# Patient Record
Sex: Male | Born: 1935 | Race: White | Hispanic: No | Marital: Married | Smoking: Former smoker
Health system: Southern US, Community
[De-identification: ages and names within clinical notes are randomized; demographics above are authoritative.]

## PROBLEM LIST (undated history)

## (undated) DIAGNOSIS — E039 Hypothyroidism, unspecified: Secondary | ICD-10-CM

## (undated) DIAGNOSIS — G20 Parkinson's disease: Secondary | ICD-10-CM

## (undated) DIAGNOSIS — H409 Unspecified glaucoma: Secondary | ICD-10-CM

## (undated) DIAGNOSIS — S0990XA Unspecified injury of head, initial encounter: Secondary | ICD-10-CM

## (undated) HISTORY — DX: Unspecified glaucoma: H40.9

## (undated) HISTORY — DX: Parkinson's disease: G20

## (undated) HISTORY — DX: Unspecified injury of head, initial encounter: S09.90XA

## (undated) HISTORY — DX: Hypothyroidism, unspecified: E03.9

## (undated) HISTORY — PX: OTHER SURGICAL HISTORY: SHX169

---

## 1989-05-18 DIAGNOSIS — S0990XA Unspecified injury of head, initial encounter: Secondary | ICD-10-CM

## 1989-05-18 HISTORY — DX: Unspecified injury of head, initial encounter: S09.90XA

## 1999-04-01 ENCOUNTER — Encounter: Payer: Self-pay | Admitting: Emergency Medicine

## 1999-04-01 ENCOUNTER — Ambulatory Visit (HOSPITAL_COMMUNITY): Admission: EM | Admit: 1999-04-01 | Discharge: 1999-04-01 | Payer: Self-pay | Admitting: Emergency Medicine

## 2013-01-10 ENCOUNTER — Ambulatory Visit (INDEPENDENT_AMBULATORY_CARE_PROVIDER_SITE_OTHER): Payer: Medicare Other | Admitting: Neurology

## 2013-01-10 ENCOUNTER — Encounter: Payer: Self-pay | Admitting: Neurology

## 2013-01-10 VITALS — BP 140/76 | HR 80 | Temp 98.4°F | Resp 16 | Ht 69.0 in | Wt 155.0 lb

## 2013-01-10 DIAGNOSIS — G20A1 Parkinson's disease without dyskinesia, without mention of fluctuations: Secondary | ICD-10-CM | POA: Insufficient documentation

## 2013-01-10 DIAGNOSIS — R269 Unspecified abnormalities of gait and mobility: Secondary | ICD-10-CM

## 2013-01-10 DIAGNOSIS — F0280 Dementia in other diseases classified elsewhere without behavioral disturbance: Secondary | ICD-10-CM | POA: Insufficient documentation

## 2013-01-10 DIAGNOSIS — G20 Parkinson's disease: Secondary | ICD-10-CM

## 2013-01-10 MED ORDER — CARBIDOPA-LEVODOPA 25-100 MG PO TABS: 1.0000 | ORAL_TABLET | Freq: Three times a day (TID) | ORAL | Status: DC

## 2013-01-10 NOTE — Progress Notes (Signed)
Stephen Cole was seen today in the movement disorders clinic for neurologic consultation at the request of Kari Baars, MD.  The consultation is for the evaluation of PD and associated memory changes.  This patient is accompanied in the office by his spouse who supplements the history.  The pt reports that sx's began about 7-8 years ago and started with R hand tremor.  He was dx with PD by Dr. Sandria Manly and no medication was recommended.  The patient has not seen Dr. Sandria Manly in many years, and I have none of those records.  Pt has hx of closed head injury in 1991.  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  There was no bleeding of the brain.  He went through 8-9 months of cognitive neurorehab.  There was no weakness associated with the injury.     Specific Symptoms:  Tremor: yes Voice: less strength than used to Sleep:   Vivid Dreams:  no  Acting out dreams:  no Wet Pillows: no Postural symptoms:  yes  Falls?  no Bradykinesia symptoms: slow movements, slowed thought processes and difficulty getting out of a chair Loss of smell:  yes Loss of taste:  yes Urinary Incontinence:  yes (rare) Difficulty Swallowing:  yes (rarely) Handwriting, micrographia: yes Trouble with ADL's:  yes  Trouble buttoning clothing: yes Depression:  no per pt Memory changes:  yes Hallucinations:  yes (rare with bugs)  visual distortions: no N/V:  no Lightheaded:  no  Syncope: no Diplopia:  no Dyskinesia:  no  Neuroimaging has not previously been performed.    PREVIOUS MEDICATIONS: none to date  ALLERGIES:  No Known Allergies  CURRENT MEDICATIONS:  No current outpatient prescriptions on file prior to visit.   No current facility-administered medications on file prior to visit.    PAST MEDICAL HISTORY:   Past Medical History  Diagnosis Date  . Closed head injury 1991  . Parkinson disease   . Hypothyroid   . Glaucoma      PAST SURGICAL HISTORY:   Past Surgical History  Procedure Laterality Date  . Partial thumb amputation      SOCIAL HISTORY:   History   Social History  . Marital Status: Married    Spouse Name: N/A    Number of Children: N/A  . Years of Education: N/A   Occupational History  . Not on file.   Social History Main Topics  . Smoking status: Former Games developer  . Smokeless tobacco: Former Neurosurgeon  . Alcohol Use: No  . Drug Use: No  . Sexual Activity: Not on file   Other Topics Concern  . Not on file   Social History Narrative  . No narrative on file    FAMILY HISTORY:   Family Status  Relation Status Death Age  . Mother Deceased 4    CHF  . Father Deceased   . Sister Deceased     cancer    ROS:  R knee pain.  A complete 10 system review of systems was obtained and was unremarkable apart from what is mentioned above.  PHYSICAL EXAMINATION:    VITALS:   Filed Vitals:   01/10/13 0832  BP: 140/76  Pulse: 80  Temp: 98.4 F (36.9 C)  TempSrc: Oral  Resp: 16  Height: 5\' 9"  (1.753 m)  Weight: 155 lb (70.308 kg)    GEN:  The patient appears stated age and  is in NAD. HEENT:  Normocephalic, atraumatic.  The mucous membranes are moist. The superficial temporal arteries are without ropiness or tenderness. CV:  RRR Lungs:  CTAB Neck/HEME:  There are no carotid bruits bilaterally.  Neurological examination:  Orientation: The patient scored a 10/30 on his MoCA.   Cranial nerves: There is good facial symmetry. There is significant facial hypomimia.  Pupils are equal round and reactive to light bilaterally. Fundoscopic exam reveals clear margins bilaterally. Extraocular muscles are intact. The visual fields are full to confrontational testing. The speech is fluent and clear. Soft palate rises symmetrically and there is no tongue deviation. Hearing is intact to conversational tone. Sensation: Sensation is intact to light and pinprick throughout (facial, trunk, extremities).  Vibration is intact at the bilateral big toe. There is no extinction with double simultaneous stimulation. There is no sensory dermatomal level identified. Motor: Strength is 5/5 in the bilateral upper and lower extremities.   Shoulder shrug is equal and symmetric.  There is no pronator drift. Deep tendon reflexes: Deep tendon reflexes are 2+/4 at the bilateral biceps, triceps, brachioradialis, 2+-3 at the bilateral patella and trace at the bilateral achilles. There is a striatal toe on the R.  Plantar response is down on the L.  Movement examination: Tone: There is increased tone in the bilateral upper extremities. The R is mod to severe and the L is mild to mod. The tone in the lower extremities is normal.  Abnormal movements: There is a near constant resting tremor in the RUE and an intermittent resting tremor in the LUE Coordination:  There is decremation with RAM's with all forms, including alternating supination and pronation of the forearm, hand opening and closing, finger taps, heel taps and toe taps. Gait and Station: The patient has  difficulty arising out of a deep-seated chair without the use of the hands. The patient's stride length is markedly decreased with shuffling and turning en bloc.  His L leg sticks to the ground when he walks.  He is bowlegged severely on the R.  Labs:  I did review labs from his primary care physician.  Labs received were last done one year ago on 11/23/2011.  Sodium was 141, potassium 4.0, chloride 107, CO2 25, BUN 24 and creatinine 1.1.  White blood cells were 4.9, hemoglobin 15.6, hematocrit 44.8 and platelets 181.  Hemoglobin A1c was 5.4.  In 2012, a B12 was completed and was 389.  ASSESSMENT/PLAN:  1.  Parkinsonism.  I suspect that this does represent idiopathic Parkinson's disease.  The patient has tremor, bradykinesia, rigidity and postural instability.  -We discussed the diagnosis as well as pathophysiology of the disease.  We discussed treatment  options as well as prognostic indicators.  Patient education was provided.  -Greater than 50% of the 80 minute visit was spent in counseling answering questions and talking about what to expect now as well as in the future.  We talked about medication options as well as potential future surgical options.  We talked about safety in the home.  -We decided to add carbidopa/levodopa 25/100.  1/2 tab tid x 1 wk, then 1/2 in am & noon & 1 at night for a week, then 1/2 in am &1 at noon &night for a week, then 1 po tid.  Risks, benefits, side effects and alternative therapies were discussed.  The opportunity to ask questions was given and they were answered to the best of my ability.  The patient expressed understanding and willingness to follow the  outlined treatment protocols.  -I wanted to refer the patient [facility] for [facility]/OT and ST.  They wanted to hold on that.  His wife suggested home therapy, which I have no objection to, but the pt wanted to hold on that for now.  We talked about the importance of cardiovascular exercise in Parkinson's disease. 2.  Memory changes, likely due to PD related dementia  -He is on aricept and he is not driving.  We discussed safety in the home. 3.  We may need to do neuroimaging in the future but decided to hold on that today.  I will see him back in the next 8 weeks, sooner should new neurologic issues arise.

## 2013-01-10 NOTE — Patient Instructions (Signed)
1.  I want you to safely exercise 2.  Start carbidopa/levodopa 25/100 as follows:  1/2 tab three times a day before meals x 1 wk, then 1/2 in am & noon & 1 in evening for a week, then 1/2 in am &1 at noon &one in evening for a week, then 1 tablet three times a day before meals 3.  Follow up on 10/31

## 2013-03-17 ENCOUNTER — Ambulatory Visit (INDEPENDENT_AMBULATORY_CARE_PROVIDER_SITE_OTHER): Payer: Medicare Other | Admitting: Neurology

## 2013-03-17 ENCOUNTER — Ambulatory Visit: Payer: Medicare Other | Admitting: Neurology

## 2013-03-17 ENCOUNTER — Encounter: Payer: Self-pay | Admitting: Neurology

## 2013-03-17 VITALS — BP 138/74 | HR 58 | Resp 14 | Wt 158.0 lb

## 2013-03-17 DIAGNOSIS — F0280 Dementia in other diseases classified elsewhere without behavioral disturbance: Secondary | ICD-10-CM

## 2013-03-17 DIAGNOSIS — G20 Parkinson's disease: Secondary | ICD-10-CM

## 2013-03-17 MED ORDER — CARBIDOPA-LEVODOPA 25-100 MG PO TABS: 2.0000 | ORAL_TABLET | Freq: Three times a day (TID) | ORAL | Status: DC

## 2013-03-17 NOTE — Patient Instructions (Signed)
1.  Increase your carbidopa/levodopa 25/100 as follows:  Week 1:  1 before breakfast and lunch and 2 before dinner  Week 2:  1 before breakfast and 2 before lunch and 2 before dinner  Week 3 and after:  2 tablets before each meal

## 2013-03-17 NOTE — Progress Notes (Signed)
Stephen Cole was seen today in the movement disorders clinic for neurologic consultation at the request of Kari Baars, MD.  The consultation is for the evaluation of PD and associated memory changes.  This patient is accompanied in the office by his spouse who supplements the history.  The pt reports that sx's began about 7-8 years ago and started with R hand tremor.  He was dx with PD by Dr. Sandria Manly and no medication was recommended.  The patient has not seen Dr. Sandria Manly in many years, and I have none of those records.  Pt has hx of closed head injury in 1991.  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  There was no bleeding of the brain.  He went through 8-9 months of cognitive neurorehab.  There was no weakness associated with the injury.    03/17/13 Update:  The patient is following up today regarding his newly diagnosed Parkinson's disease.  His wife was present in the lobby, but does not accompany him to the room.  We started levodopa in August, 2014.  The patient refused therapies.  The patient does not think that the levodopa has been beneficial.  He has not had any falls.  He denies hallucinations.  He denies nausea or vomiting.  He denies lightheadedness or near syncope.   Neuroimaging has not previously been performed.    PREVIOUS MEDICATIONS: none to date  ALLERGIES:  No Known Allergies  CURRENT MEDICATIONS:  Current Outpatient Prescriptions on File Prior to Visit  Medication Sig Dispense Refill  . carbidopa-levodopa (SINEMET IR) 25-100 MG per tablet Take 1 tablet by mouth 3 (three) times daily.  90 tablet  3  . donepezil (ARICEPT) 10 MG tablet       . mirtazapine (REMERON) 15 MG tablet Take 15 mg by mouth every other day.      . simvastatin (ZOCOR) 40 MG tablet       . SYNTHROID 100 MCG tablet        No current facility-administered medications on file prior to visit.    PAST MEDICAL HISTORY:   Past  Medical History  Diagnosis Date  . Closed head injury 1991  . Parkinson disease   . Hypothyroid   . Glaucoma     PAST SURGICAL HISTORY:   Past Surgical History  Procedure Laterality Date  . Partial thumb amputation      SOCIAL HISTORY:   History   Social History  . Marital Status: Married    Spouse Name: N/A    Number of Children: N/A  . Years of Education: N/A   Occupational History  . Not on file.   Social History Main Topics  . Smoking status: Former Games developer  . Smokeless tobacco: Former Neurosurgeon  . Alcohol Use: No  . Drug Use: No  . Sexual Activity: Not on file   Other Topics Concern  . Not on file   Social History Narrative  . No narrative on file    FAMILY HISTORY:   Family Status  Relation Status Death Age  . Mother Deceased 97    CHF  . Father Deceased   . Sister Deceased     cancer    ROS:  R knee pain.  A complete 10 system review of systems was obtained and was unremarkable apart from what is mentioned above.  PHYSICAL EXAMINATION:    VITALS:   There were no  vitals filed for this visit.  GEN:  The patient appears stated age and is in NAD.  He is agitated today. HEENT:  Normocephalic, atraumatic.  The mucous membranes are moist. The superficial temporal arteries are without ropiness or tenderness. CV:  RRR Lungs:  CTAB Neck/HEME:  There are no carotid bruits bilaterally.  Neurological examination:  Orientation: The patient scored a 10/30 on his MoCA last visit.   Cranial nerves: There is good facial symmetry. There is significant facial hypomimia.  Pupils are equal round and reactive to light bilaterally. Fundoscopic exam reveals clear margins bilaterally. Extraocular muscles are intact. The visual fields are full to confrontational testing. The speech is fluent and clear. Soft palate rises symmetrically and there is no tongue deviation. Hearing is intact to conversational tone. Sensation: Sensation is intact to light touch throughout. Motor:  Strength is 5/5 in the bilateral upper and lower extremities.   Shoulder shrug is equal and symmetric.  There is no pronator drift. Deep tendon reflexes: Not tested today.  Movement examination: Tone: There is increased tone in the right upper extremity.  Tone in the left is normal today. Abnormal movements: There is a near constant resting tremor in the RUE.  No tremor on the left was noted today. Coordination:  There is decremation with RAM's with all forms, including alternating supination and pronation of the forearm, hand opening and closing, finger taps, heel taps and toe taps.  This was primarily seen on the right today. Gait and Station: The patient has  difficulty arising out of a deep-seated chair without the use of the hands. The patient's stride length is markedly decreased with shuffling and turning en bloc.    Labs:  I did review labs from his primary care physician.  Labs received were last done one year ago on 11/23/2011.  Sodium was 141, potassium 4.0, chloride 107, CO2 25, BUN 24 and creatinine 1.1.  White blood cells were 4.9, hemoglobin 15.6, hematocrit 44.8 and platelets 181.  Hemoglobin A1c was 5.4.  In 2012, a B12 was completed and was 389.  ASSESSMENT/PLAN:  1.  Parkinsonism.  I suspect that this does represent idiopathic Parkinson's disease.  The patient has tremor, bradykinesia, rigidity and postural instability.  -We discussed the diagnosis as well as pathophysiology of the disease.  We discussed treatment options as well as prognostic indicators.  Patient education was provided.  -Although the patient does not think that the medication is helping, I do think it is helping based on a comparison of my examination last visit.  However, I do think that he needs an increase the dose of the carbidopa/levodopa.  He was very resistant, but ultimately was agreeable to increase the medication slowly to a total of 2 tablets 3 times per day.  Risks, benefits, side effects and  alternative therapies were discussed.  The opportunity to ask questions was given and they were answered to the best of my ability.  The patient expressed understanding and willingness to follow the outlined treatment protocols.  -I again wanted to refer the patient to the Parkinson's program at the [facility], for PT/OT and ST.  He refused. 2.  Memory changes, likely due to PD related dementia  -He is on aricept and he is not driving.  We discussed safety in the home. 3.  We may need to do neuroimaging in the future given his hx of TBI but pt refused today.

## 2013-05-01 ENCOUNTER — Other Ambulatory Visit: Payer: Self-pay

## 2013-05-01 MED ORDER — CARBIDOPA-LEVODOPA 25-100 MG PO TABS: 2.0000 | ORAL_TABLET | Freq: Three times a day (TID) | ORAL | Status: DC

## 2013-06-16 ENCOUNTER — Ambulatory Visit: Payer: Medicare Other | Admitting: Neurology

## 2013-06-19 ENCOUNTER — Ambulatory Visit: Payer: Medicare Other | Admitting: Neurology

## 2013-06-22 ENCOUNTER — Telehealth: Payer: Self-pay | Admitting: Neurology

## 2013-06-22 NOTE — Telephone Encounter (Signed)
Pt no showed 3 month follow up 06/19/13. No show letter mailed to pt  Roanna Raider/Sherri

## 2013-07-04 ENCOUNTER — Ambulatory Visit: Payer: Medicare Other | Admitting: Neurology

## 2013-07-05 ENCOUNTER — Telehealth: Payer: Self-pay | Admitting: Neurology

## 2013-07-05 NOTE — Telephone Encounter (Signed)
LM for pt to return my call to r/s appt. Office was closed d/t inclement weather, Currently awaiting a return call / Sherri S.

## 2013-08-15 ENCOUNTER — Ambulatory Visit (INDEPENDENT_AMBULATORY_CARE_PROVIDER_SITE_OTHER): Payer: Medicare Other | Admitting: Neurology

## 2013-08-15 ENCOUNTER — Encounter: Payer: Self-pay | Admitting: Neurology

## 2013-08-15 VITALS — BP 126/76 | HR 60 | Resp 14 | Ht 69.0 in | Wt 161.0 lb

## 2013-08-15 DIAGNOSIS — Z87820 Personal history of traumatic brain injury: Secondary | ICD-10-CM

## 2013-08-15 DIAGNOSIS — F0280 Dementia in other diseases classified elsewhere without behavioral disturbance: Secondary | ICD-10-CM

## 2013-08-15 DIAGNOSIS — G20 Parkinson's disease: Secondary | ICD-10-CM

## 2013-08-15 MED ORDER — CARBIDOPA-LEVODOPA 25-100 MG PO TABS
2.0000 | ORAL_TABLET | Freq: Three times a day (TID) | ORAL | Status: DC
Start: 2013-08-15 — End: 2014-03-07

## 2013-08-15 NOTE — Patient Instructions (Signed)
Let us know if you are interested in the [facility] physical therapy program. Follow up in 6 months.

## 2013-08-15 NOTE — Progress Notes (Signed)
Stephen Cole was seen today in the movement disorders clinic for neurologic consultation at the request of Martha Clan, MD.  The consultation is for the evaluation of PD and associated memory changes.  This patient is accompanied in the office by his spouse who supplements the history.  The pt reports that sx's began about 7-8 years ago and started with R hand tremor.  He was dx with PD by Dr. Sandria Manly and no medication was recommended.  The patient has not seen Dr. Sandria Manly in many years, and I have none of those records.  Pt has hx of closed head injury in 1991.  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  There was no bleeding of the brain.  He went through 8-9 months of cognitive neurorehab.  There was no weakness associated with the injury.    03/17/13 Update:  The patient is following up today regarding his newly diagnosed Parkinson's disease.  His wife was present in the lobby, but does not accompany him to the room.  We started levodopa in August, 2014.  The patient refused therapies.  The patient does not think that the levodopa has been beneficial.  He has not had any falls.  He denies hallucinations.  He denies nausea or vomiting.  He denies lightheadedness or near syncope.  08/15/13 update:  Pt is f/u regarding PD.  He n/s his 3 month f/u.  When I last saw him in October, I increased his levodopa to 2 po tid.  Pt states at the beginning of the visit that he no longer wishes to take the medication.  "I don't think that it is worth it."  It has not helped the tremor but he agrees it may have helped stiffness.  His wife states that over the years he has become very angry.  He is tired of feeling that he has to "go along with the game."  His wife states that he will have periods of time when he doesn't shake at all (when reading) but when anxious, he shakes a lot.  He did not take levodopa this morning, and last took it about 10pm  last evening.  Unfortunately, he has continued to refuse PD related therapies.  He is on aricept for PDD.  He denies falls, hallucinations, lightheadedness, near syncope, dyskinesia.   Neuroimaging has not previously been performed.    PREVIOUS MEDICATIONS: none to date  ALLERGIES:  No Known Allergies  CURRENT MEDICATIONS:  Current Outpatient Prescriptions on File Prior to Visit  Medication Sig Dispense Refill  . carbidopa-levodopa (SINEMET IR) 25-100 MG per tablet Take 2 tablets by mouth 3 (three) times daily.  180 tablet  3  . donepezil (ARICEPT) 10 MG tablet       . mirtazapine (REMERON) 15 MG tablet Take 15 mg by mouth every other day.      . simvastatin (ZOCOR) 40 MG tablet       . SYNTHROID 100 MCG tablet        No current facility-administered medications on file prior to visit.    PAST MEDICAL HISTORY:   Past Medical History  Diagnosis Date  . Closed head injury 1991  . Parkinson disease   . Hypothyroid   . Glaucoma     PAST SURGICAL HISTORY:   Past Surgical History  Procedure Laterality Date  . Partial thumb amputation      SOCIAL HISTORY:  History   Social History  . Marital Status: Married    Spouse Name: N/A    Number of Children: N/A  . Years of Education: N/A   Occupational History  . Not on file.   Social History Main Topics  . Smoking status: Former Games developer  . Smokeless tobacco: Former Neurosurgeon  . Alcohol Use: No  . Drug Use: No  . Sexual Activity: Not on file   Other Topics Concern  . Not on file   Social History Narrative  . No narrative on file    FAMILY HISTORY:   Family Status  Relation Status Death Age  . Mother Deceased 89    CHF  . Father Deceased   . Sister Deceased     cancer    ROS:  R knee pain.  A complete 10 system review of systems was obtained and was unremarkable apart from what is mentioned above.  PHYSICAL EXAMINATION:    VITALS:   There were no vitals filed for this visit.  GEN:  The patient appears stated  age and is in NAD.  He is agitated today. HEENT:  Normocephalic, atraumatic.  The mucous membranes are moist. The superficial temporal arteries are without ropiness or tenderness. CV:  RRR Lungs:  CTAB Neck/HEME:  There are no carotid bruits bilaterally.  Neurological examination:  Orientation: The patient scored a 10/30 on his MoCA last visit.   Cranial nerves: There is good facial symmetry. There is significant facial hypomimia.  Pupils are equal round and reactive to light bilaterally. Fundoscopic exam reveals clear margins bilaterally. Extraocular muscles are intact. The visual fields are full to confrontational testing. The speech is fluent and clear. Soft palate rises symmetrically and there is no tongue deviation. Hearing is intact to conversational tone. Sensation: Sensation is intact to light touch throughout. Motor: Strength is 5/5 in the bilateral upper and lower extremities.   Shoulder shrug is equal and symmetric.  There is no pronator drift. Deep tendon reflexes: Not tested today.  Movement examination: Tone: There is increased tone in the right upper extremity.  Tone in the left is normal today. Abnormal movements: There is a near constant resting tremor in the RUE.  No tremor on the left was noted today. Coordination:  There is decremation with RAM's with all forms, including alternating supination and pronation of the forearm, hand opening and closing, finger taps, heel taps and toe taps.  This was primarily seen on the right today. Gait and Station: The patient has  difficulty arising out of a deep-seated chair without the use of the hands. The patient's stride length is very wide based, no shuffling today.  Turns are better.  Labs:  Last labs I have were from 11/23/2011.  Sodium was 141, potassium 4.0, chloride 107, CO2 25, BUN 24 and creatinine 1.1.  White blood cells were 4.9, hemoglobin 15.6, hematocrit 44.8 and platelets 181.  Hemoglobin A1c was 5.4.  In 2012, a B12 was  completed and was 389.  ASSESSMENT/PLAN:  1.  Parkinsonism.  I suspect that this does represent idiopathic Parkinson's disease.  The patient has tremor, bradykinesia, rigidity and postural instability.  -We discussed the diagnosis as well as pathophysiology of the disease.  We discussed treatment options as well as prognostic indicators.  Patient education was provided.  -I had a very long discussion with the pt and his wife.  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  He  doesn't disagree and ultimately decided to stay on carbidopa/levodopa 25/100, 2 po tid.  Risks, benefits, side effects and alternative therapies were discussed.  The opportunity to ask questions was given and they were answered to the best of my ability.  The patient expressed understanding and willingness to follow the outlined treatment protocols.  -I again wanted to refer the patient to the Parkinson's program and we discussed the [facility] as he cannot get out of the home as his wife is the driver and she works.  He was unwilling but agreeable to "think about it" and literature was given.  -I offered the pt referral to a different movement center as he has been resistant to care here but he refused.  He will let me know if he changes his mind. 2.  Memory changes, likely due to PD related dementia  -He is on aricept and he is not driving.  We discussed safety in the home. 3.  We may need to do neuroimaging in the future given his hx of TBI but pt refused again today.

## 2014-02-16 ENCOUNTER — Ambulatory Visit: Payer: Medicare Other | Admitting: Neurology

## 2014-02-19 ENCOUNTER — Telehealth: Payer: Self-pay | Admitting: Neurology

## 2014-02-19 NOTE — Telephone Encounter (Signed)
Pt no showed 02/16/14 appt w/ Dr. Arbutus Leasat.  Alcario DroughtErica - please send pt a no show letter / Oneita KrasSherri S.

## 2014-02-21 ENCOUNTER — Encounter: Payer: Self-pay | Admitting: *Deleted

## 2014-02-21 NOTE — Progress Notes (Signed)
No show letters sent for 05/19/2013

## 2014-02-26 ENCOUNTER — Other Ambulatory Visit: Payer: Self-pay | Admitting: Neurology

## 2014-02-26 NOTE — Telephone Encounter (Signed)
[facility] Denied and faxed back to [phone] with confirmation received. Patient needs appt.

## 2014-03-07 ENCOUNTER — Telehealth: Payer: Self-pay | Admitting: Neurology

## 2014-03-07 MED ORDER — CARBIDOPA-LEVODOPA 25-100 MG PO TABS: 2.0000 | ORAL_TABLET | Freq: Three times a day (TID) | ORAL | Status: DC

## 2014-03-07 NOTE — Telephone Encounter (Signed)
Carbidopa Levodopa refill requested. Per last office note- patient to remain on medication. Refill approved and sent to patient's pharmacy. Patient's daughter made aware.

## 2014-03-07 NOTE — Telephone Encounter (Signed)
Darl PikesSusan, pt's daughter called requesting a refill for pt's SINEMET. C/B [phone]

## 2014-03-16 ENCOUNTER — Encounter: Payer: Self-pay | Admitting: Neurology

## 2014-03-16 ENCOUNTER — Telehealth: Payer: Self-pay | Admitting: Neurology

## 2014-03-16 ENCOUNTER — Ambulatory Visit (INDEPENDENT_AMBULATORY_CARE_PROVIDER_SITE_OTHER): Payer: Medicare Other | Admitting: Neurology

## 2014-03-16 VITALS — BP 134/68 | HR 68 | Ht 68.0 in | Wt 169.0 lb

## 2014-03-16 DIAGNOSIS — G20 Parkinson's disease: Secondary | ICD-10-CM

## 2014-03-16 DIAGNOSIS — F0280 Dementia in other diseases classified elsewhere without behavioral disturbance: Secondary | ICD-10-CM

## 2014-03-16 DIAGNOSIS — Z87820 Personal history of traumatic brain injury: Secondary | ICD-10-CM

## 2014-03-16 NOTE — Telephone Encounter (Signed)
[facility]/OT with home safety evaluation faxed to [phone] with confirmation received. They will contact patient.

## 2014-03-16 NOTE — Progress Notes (Signed)
Stephen Cole was seen today in the movement disorders clinic for neurologic consultation at the request of Martha Clan, MD.  The consultation is for the evaluation of PD and associated memory changes.  This patient is accompanied in the office by his spouse who supplements the history.  The pt reports that sx's began about 7-8 years ago and started with R hand tremor.  He was dx with PD by Dr. Sandria Manly and no medication was recommended.  The patient has not seen Dr. Sandria Manly in many years, and I have none of those records.  Pt has hx of closed head injury in 1991.  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  There was no bleeding of the brain.  He went through 8-9 months of cognitive neurorehab.  There was no weakness associated with the injury.    03/17/13 Update:  The patient is following up today regarding his newly diagnosed Parkinson's disease.  His wife was present in the lobby, but does not accompany him to the room.  We started levodopa in August, 2014.  The patient refused therapies.  The patient does not think that the levodopa has been beneficial.  He has not had any falls.  He denies hallucinations.  He denies nausea or vomiting.  He denies lightheadedness or near syncope.  08/15/13 update:  Pt is f/u regarding PD.  He n/s his 3 month f/u.  When I last saw him in October, I increased his levodopa to 2 po tid.  Pt states at the beginning of the visit that he no longer wishes to take the medication.  "I don't think that it is worth it."  It has not helped the tremor but he agrees it may have helped stiffness.  His wife states that over the years he has become very angry.  He is tired of feeling that he has to "go along with the game."  His wife states that he will have periods of time when he doesn't shake at all (when reading) but when anxious, he shakes a lot.  He did not take levodopa this morning, and last took it about 10pm  last evening.  Unfortunately, he has continued to refuse PD related therapies.  He is on aricept for PDD.  He denies falls, hallucinations, lightheadedness, near syncope, dyskinesia.  03/16/14 update:  Pt is f/u today, accompanied by his wife who supplements the history.  Pt has levodopa resistant tremor but is on carbidopa/levodopa 25/100 for PD and is on 2 po tid.  Pt fell last week in the kitchen last week.  There was no LOC.  It was unwitnessed.  He doesn't remember how he got off of the floor and he states that he doesn't even recall if he had taken meds that day but thinks he had.  Wife states that he actually ran out of medication for a week to 10 days and was just back on medication for a short period of time.  Wife states that she "is not a good wife/caregiver" and just didn't notice walk when he was off of medication.  No hallucinations.  Pt sleeps with mattress on the floor and wife states that "I have no idea how he gets off of the floor."  Wants to look into hospital bed.   Neuroimaging has not previously been performed.    PREVIOUS MEDICATIONS: none to date  ALLERGIES:  No Known Allergies  CURRENT MEDICATIONS:  Current Outpatient Prescriptions on File Prior to Visit  Medication Sig Dispense Refill  . carbidopa-levodopa (SINEMET IR) 25-100 MG per tablet Take 2 tablets by mouth 3 (three) times daily.  540 tablet  1  . donepezil (ARICEPT) 10 MG tablet Take 10 mg by mouth at bedtime.       Marland Kitchen. latanoprost (XALATAN) 0.005 % ophthalmic solution Place 1 drop into both eyes at bedtime.       . simvastatin (ZOCOR) 40 MG tablet Take 40 mg by mouth daily at 6 PM.       . SYNTHROID 100 MCG tablet Take 100 mcg by mouth daily before breakfast.        No current facility-administered medications on file prior to visit.    PAST MEDICAL HISTORY:   Past Medical History  Diagnosis Date  . Closed head injury 1991  . Parkinson disease   . Hypothyroid   . Glaucoma     PAST SURGICAL HISTORY:    Past Surgical History  Procedure Laterality Date  . Partial thumb amputation      SOCIAL HISTORY:   History   Social History  . Marital Status: Married    Spouse Name: N/A    Number of Children: N/A  . Years of Education: N/A   Occupational History  . Not on file.   Social History Main Topics  . Smoking status: Former Games developermoker  . Smokeless tobacco: Former NeurosurgeonUser  . Alcohol Use: No  . Drug Use: No  . Sexual Activity: Not on file   Other Topics Concern  . Not on file   Social History Narrative  . No narrative on file    FAMILY HISTORY:   Family Status  Relation Status Death Age  . Mother Deceased 6571    CHF  . Father Deceased   . Sister Deceased     cancer    ROS:   A complete 10 system review of systems was obtained and was unremarkable apart from what is mentioned above.  PHYSICAL EXAMINATION:    VITALS:   Filed Vitals:   03/16/14 1343  BP: 134/68  Pulse: 68  Height: 5\' 8"  (1.727 m)  Weight: 169 lb (76.658 kg)    GEN:  The patient appears stated age and is in NAD.   HEENT:  Normocephalic, atraumatic.  The mucous membranes are moist. The superficial temporal arteries are without ropiness or tenderness. CV:  RRR Lungs:  CTAB Neck/HEME:  There are no carotid bruits bilaterally.  Neurological examination:  Orientation: Alert and oriented x 3 today.   Cranial nerves: There is good facial symmetry. There is significant facial hypomimia.  Pupils are equal round and reactive to light bilaterally. Fundoscopic exam reveals clear margins bilaterally. Extraocular muscles are intact. The visual fields are full to confrontational testing. The speech is fluent and clear. Soft palate rises symmetrically and there is no tongue deviation. Hearing is intact to conversational tone. Sensation: Sensation is intact to light touch throughout. Motor: Strength is 5/5 in the bilateral upper and lower extremities.   Shoulder shrug is equal and symmetric.  There is no pronator  drift. Deep tendon reflexes: Not tested today.  Movement examination: Tone: There is mild increased tone in the right upper extremity but hadn't taken medication since 9:30 am and was seen at 2 PM.  Tone in the left is normal today. Abnormal movements: There is a intermittent tremor of the RUE.  No tremor on the left was noted today. Coordination:  There is decremation with RAM's with all forms, including alternating supination and pronation of the forearm, hand opening and closing, finger taps, heel taps and toe taps.  This was seen bilaterally today Gait and Station: The patient has  difficulty arising out of a deep-seated chair without the use of the hands.  The patient had pretty significant freezing upon arising and getting out of the doorway.  Once in the open hallway he did good until asked to turn around, and then he had freezing again.  He turns en bloc.  Because the patient had missed the middle of the day dosage of levodopa, I opted to give him 250 mg of levodopa dissolved in ginger ale and waited 30 minutes and went back and reexamined him again.  Tone in the right upper extremity was just mildly improved.  He remained tremulous.  He still had freezing when he first got up and when he turns.  He continued to have some decremation with rapid alternating movements.  Labs:  Last labs I have were from 11/23/2011.  Sodium was 141, potassium 4.0, chloride 107, CO2 25, BUN 24 and creatinine 1.1.  White blood cells were 4.9, hemoglobin 15.6, hematocrit 44.8 and platelets 181.  Hemoglobin A1c was 5.4.  In 2012, a B12 was completed and was 389.  ASSESSMENT/PLAN:  1.  Parkinsonism.  I suspect that this does represent idiopathic Parkinson's disease.  The patient has tremor, bradykinesia, rigidity and postural instability.  -We discussed the diagnosis as well as pathophysiology of the disease.  We discussed treatment options as well as prognostic indicators.  Patient education was provided.  -I had  a very long discussion with the pt and his wife.  As above, he had missed his levodopa today, so I had him take it and went back and reexamined him and there was not a huge difference.  I'm not sure if this was because I did not give it long enough (waited 30 minutes) or if I did not give a big enough dosage, or if the levodopa just did not help.  I really want to do a true on/off test, but he did not want me to schedule that.  I told him we could stop the medication we'll continue the current course, and ultimately he said that he just "wanted to leave the office and continue his current medication."  Therefore, we will continue the carbidopa/levodopa 25/100, 2 tablets 3 times per day.  -I again wanted to refer the patient to the Parkinson's program and we discussed the [facility] program as he cannot get out of the home as his wife is the driver and she works.  He was initially unwilling again, but after his wife talked to him, he was at least willing to let me make the referral.  I am not sure that he will actually participate with the physical therapy, but hopefully the occupational therapy home safety evaluation will be done.   2.  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   3.  Pt refuses to be seen more than every 6 months but if changes his mind will be happy to see him.  I do think that he needs more help in the home as his wife is working and cannot be there but this is not an acceptable concept to him.  Total face to face time was 60 min (2 separate 30 min sessions, as above),  with greater than 50% in counseling

## 2014-03-29 ENCOUNTER — Telehealth: Payer: Self-pay | Admitting: Neurology

## 2014-03-29 NOTE — Telephone Encounter (Signed)
-----   Message from Shary DecampAndrea S Loye sent at 03/28/2014  9:07 AM EST ----- Regarding: [facility]ish H [facility] Contact: [phone] Clarisa FlingKeish called regarding pt's home health care services. [facility] has not been able to contact the pt so she wanted to confirm if the services are still needed for the pt.  Please call her back.

## 2014-03-29 NOTE — Telephone Encounter (Signed)
Fabian SharpJack Macner from [facility] [phone] has not been able to see patient and they have left several messages for them to call to set up appt and no return calls

## 2014-03-29 NOTE — Telephone Encounter (Signed)
Received handwritten note from [facility]Lisa at [facility] that due to [facility]

## 2014-03-29 NOTE — Telephone Encounter (Signed)
[facility] and they state [facility] has not been able to contact patient - want to know if therapy is needed. Made aware that therapy is still recommended.

## 2014-06-23 ENCOUNTER — Other Ambulatory Visit: Payer: Self-pay | Admitting: Neurology

## 2014-06-25 NOTE — Telephone Encounter (Signed)
Carbidopa Levodopa refill requested. Per last office note- patient to remain on medication. Refill approved and sent to patient's pharmacy.   

## 2014-09-21 ENCOUNTER — Ambulatory Visit (INDEPENDENT_AMBULATORY_CARE_PROVIDER_SITE_OTHER): Payer: Medicare Other | Admitting: Neurology

## 2014-09-21 ENCOUNTER — Encounter: Payer: Self-pay | Admitting: Neurology

## 2014-09-21 ENCOUNTER — Telehealth: Payer: Self-pay | Admitting: Neurology

## 2014-09-21 VITALS — BP 170/84 | HR 92 | Ht 69.0 in | Wt 173.0 lb

## 2014-09-21 DIAGNOSIS — G20 Parkinson's disease: Secondary | ICD-10-CM

## 2014-09-21 DIAGNOSIS — F0280 Dementia in other diseases classified elsewhere without behavioral disturbance: Secondary | ICD-10-CM

## 2014-09-21 MED ORDER — CARBIDOPA-LEVODOPA 25-100 MG PO TABS: 2.0000 | ORAL_TABLET | Freq: Four times a day (QID) | ORAL | Status: DC

## 2014-09-21 NOTE — Telephone Encounter (Signed)
-----   Message from Octaviano Battyebecca S Tat, DO sent at 09/21/2014  3:31 PM EDT ----- Forgot that wife wanted list of "bracelets" for dementia (I think you have a dot phrase for this) and devices that are out there for wearing and pushing button if need help.  Also the comfort zone gps tracking device.  Can you send to her

## 2014-09-21 NOTE — Patient Instructions (Signed)
1. Continue Levodopa - you can take two extra tablets daily if needed. New prescription sent to pharmacy.

## 2014-09-21 NOTE — Telephone Encounter (Signed)
Letter mailed to patient with information provided.

## 2014-09-21 NOTE — Progress Notes (Signed)
Stephen Cole was seen today in the movement disorders clinic for neurologic consultation at the request of [name], [name], MD.  The consultation is for the evaluation of PD and associated memory changes.  This patient is accompanied in the office by his spouse who supplements the history.  The pt reports that sx's began about 7-8 years ago and started with R hand tremor.  He was dx with PD by Dr. [name] and no medication was recommended.  The patient has not seen Dr. [name] in many years, and I have none of those records.  Pt has hx of closed head injury in 1991.  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  There was no bleeding of the brain.  He went through 8-9 months of cognitive neurorehab.  There was no weakness associated with the injury.    03/17/13 Update:  The patient is following up today regarding his newly diagnosed Parkinson's disease.  His wife was present in the lobby, but does not accompany him to the room.  We started levodopa in August, 2014.  The patient refused therapies.  The patient does not think that the levodopa has been beneficial.  He has not had any falls.  He denies hallucinations.  He denies nausea or vomiting.  He denies lightheadedness or near syncope.  08/15/13 update:  Pt is f/u regarding PD.  He n/s his 3 month f/u.  When I last saw him in October, I increased his levodopa to 2 po tid.  Pt states at the beginning of the visit that he no longer wishes to take the medication.  "I don't think that it is worth it."  It has not helped the tremor but he agrees it may have helped stiffness.  His wife states that over the years he has become very angry.  He is tired of feeling that he has to "go along with the game."  His wife states that he will have periods of time when he doesn't shake at all (when reading) but when anxious, he shakes a lot.  He did not take levodopa this morning, and last took it about 10pm  last evening.  Unfortunately, he has continued to refuse PD related therapies.  He is on aricept for PDD.  He denies falls, hallucinations, lightheadedness, near syncope, dyskinesia.  03/16/14 update:  Pt is f/u today, accompanied by his wife who supplements the history.  Pt has levodopa resistant tremor but is on carbidopa/levodopa 25/100 for PD and is on 2 po tid.  Pt fell last week in the kitchen last week.  There was no LOC.  It was unwitnessed.  He doesn't remember how he got off of the floor and he states that he doesn't even recall if he had taken meds that day but thinks he had.  Wife states that he actually ran out of medication for a week to 10 days and was just back on medication for a short period of time.  Wife states that she "is not a good wife/caregiver" and just didn't notice walk when he was off of medication.  No hallucinations.  Pt sleeps with mattress on the floor and wife states that "I have no idea how he gets off of the floor."  Wants to look into hospital bed.  09/21/14 update:  Pt returns with wife, who supplements the hx.  Tried to refer him for in home therapy but 2  different companies tried multiple times to contact them and they didn't return their calls so he has had no therapy. His wife states that she didn't know this and the patient must have deleted the phone calls.   He doesn't think that his carbidopa/levodopa works but has refused on/off test.  He is chewing up 6 dosages of levodopa throughout the day.  His wife brings in an article about duopa and asks about this and the possibility for him.  His wife has been off of work for a month but notices more start hesitation.  She is walking with him outside every morning.     Neuroimaging has not previously been performed.    PREVIOUS MEDICATIONS: none to date  ALLERGIES:  No Known Allergies  CURRENT MEDICATIONS:  Current Outpatient Prescriptions on File Prior to Visit  Medication Sig Dispense Refill  .  carbidopa-levodopa (SINEMET IR) 25-100 MG per tablet Take 2 tablets by mouth 3 (three) times daily. 540 tablet 1  . donepezil (ARICEPT) 10 MG tablet Take 10 mg by mouth at bedtime.     Marland Kitchen. latanoprost (XALATAN) 0.005 % ophthalmic solution Place 1 drop into both eyes at bedtime.     . simvastatin (ZOCOR) 40 MG tablet Take 40 mg by mouth daily at 6 PM.     . SYNTHROID 100 MCG tablet Take 100 mcg by mouth daily before breakfast.      No current facility-administered medications on file prior to visit.    PAST MEDICAL HISTORY:   Past Medical History  Diagnosis Date  . Closed head injury 1991  . Parkinson disease   . Hypothyroid   . Glaucoma     PAST SURGICAL HISTORY:   Past Surgical History  Procedure Laterality Date  . Partial thumb amputation      SOCIAL HISTORY:   History   Social History  . Marital Status: Married    Spouse Name: N/A  . Number of Children: N/A  . Years of Education: N/A   Occupational History  . Not on file.   Social History Main Topics  . Smoking status: Former Games developermoker  . Smokeless tobacco: Former NeurosurgeonUser  . Alcohol Use: No  . Drug Use: No  . Sexual Activity: Not on file   Other Topics Concern  . Not on file   Social History Narrative    FAMILY HISTORY:   Family Status  Relation Status Death Age  . Mother Deceased 6771    CHF  . Father Deceased   . Sister Deceased     cancer    ROS:   A complete 10 system review of systems was obtained and was unremarkable apart from what is mentioned above.  PHYSICAL EXAMINATION:    VITALS:   Filed Vitals:   09/21/14 1401  BP: 170/84  Pulse: 92  Height: 5\' 9"  (1.753 m)  Weight: 173 lb (78.472 kg)    GEN:  The patient appears stated age and is in NAD.   HEENT:  Normocephalic, atraumatic.  The mucous membranes are moist. The superficial temporal arteries are without ropiness or tenderness. CV:  RRR Lungs:  CTAB Neck/HEME:  There are no carotid bruits bilaterally.  Neurological  examination:  Orientation: Alert and oriented x 3 today.   Cranial nerves: There is good facial symmetry. There is significant facial hypomimia.  Pupils are equal round and reactive to light bilaterally. Fundoscopic exam reveals clear margins bilaterally. Extraocular muscles are intact. The visual fields are full to confrontational testing. The speech  is fluent and clear. Soft palate rises symmetrically and there is no tongue deviation. Hearing is intact to conversational tone. Sensation: Sensation is intact to light touch throughout. Motor: Strength is 5/5 in the bilateral upper and lower extremities.   Shoulder shrug is equal and symmetric.  There is no pronator drift. Deep tendon reflexes: Not tested today.  Movement examination: Tone: There is normal tone in the bilateral upper extremity today. Abnormal movements: There is a intermittent tremor of the RUE.  No tremor on the left was noted today. Coordination:  There is decremation with RAM's with all forms, including alternating supination and pronation of the forearm, hand opening and closing, finger taps, heel taps and toe taps.  This was seen bilaterally today Gait and Station: The patient has  difficulty arising out of a deep-seated chair without the use of the hands.  He has start hesitation.  He ambulates with a walking stick.  Once he is out in the hallway, he marches with short steps.  He freezes up on the turn, but when reminded to take a large step he is able to escape the freezing.   ASSESSMENT/PLAN:  1.  Parkinsonism.  I suspect that this does represent idiopathic Parkinson's disease.  The patient has tremor, bradykinesia, rigidity and postural instability.  -We discussed the diagnosis as well as pathophysiology of the disease.  We discussed treatment options as well as prognostic indicators.  Patient education was provided.  -He again refuses a formal on/off test.  In the past, I have given him levodopa in the office and did not  see a huge benefit with that, but ultimately the patient decided to stay with the medication.  I'm not sure if lack of significant benefit was because I did not give it long enough (waited 30 minutes) or if I did not give a big enough dosage, or if the levodopa just did not help.  His wife asked if we can slightly increase the dosage, and I really have no objection to that.  I told him he can take anywhere between 6-8 tablets per day.  I do prefer he stops chewing up each dose, as it will not last as long.  -Try to refer the patient to the Parkinson's therapy programs, but they tried to call him many times and were unable to contact him.  He does not want to participate.  -Wife asked me about duopa.  I went over this with both the patient and his wife in detail.  I went over the risks and benefits of it.  I was going to show him the size of the pump, and the patient stated that he does not wish to see it as he is not going to consider it.  -I talked to his wife and the patient about tricks that they can try to initiate gait if he is frozen. 2.  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  She is supposed to check and let me know.  -She asked me about devices that he could wear and push a button if he fell and needed assistance.  I forgot to give the names of the companies to her before they left, so we will send it in the mail. 3.  Pt refuses to be seen more than every 6 months but if changes his mind will be happy to see him.  I do think that  he needs more help in the home as his wife is working and cannot be there but this is not an acceptable concept to him.  Total face to face time was 40 minutes, with much greater than 50% of the visit in counseling discussing safety again.

## 2015-02-06 ENCOUNTER — Other Ambulatory Visit: Payer: Self-pay | Admitting: Neurology

## 2015-02-06 NOTE — Telephone Encounter (Signed)
Carbidopa Levodopa 25/100 refill requested. Per last office note- patient to remain on medication. Refill approved and sent to patient's pharmacy.   

## 2015-03-26 ENCOUNTER — Ambulatory Visit (INDEPENDENT_AMBULATORY_CARE_PROVIDER_SITE_OTHER): Payer: Medicare Other | Admitting: Neurology

## 2015-03-26 ENCOUNTER — Encounter: Payer: Self-pay | Admitting: Neurology

## 2015-03-26 VITALS — BP 150/70 | HR 103 | Wt 171.1 lb

## 2015-03-26 DIAGNOSIS — Z87820 Personal history of traumatic brain injury: Secondary | ICD-10-CM | POA: Diagnosis not present

## 2015-03-26 DIAGNOSIS — F0281 Dementia in other diseases classified elsewhere with behavioral disturbance: Secondary | ICD-10-CM | POA: Diagnosis not present

## 2015-03-26 DIAGNOSIS — G20 Parkinson's disease: Secondary | ICD-10-CM | POA: Diagnosis not present

## 2015-03-26 DIAGNOSIS — F02818 Dementia in other diseases classified elsewhere, unspecified severity, with other behavioral disturbance: Secondary | ICD-10-CM

## 2015-03-26 MED ORDER — RAISED TOILET SEAT MISC: 1.0000 | Freq: Every day | Status: DC

## 2015-03-26 NOTE — Progress Notes (Signed)
Stephen Cole was seen today in the movement disorders clinic for neurologic consultation at the request of Martha Clan, MD.  The consultation is for the evaluation of PD and associated memory changes.  This patient is accompanied in the office by his spouse who supplements the history.  The pt reports that sx's began about 7-8 years ago and started with R hand tremor.  He was dx with PD by Dr. Sandria Manly and no medication was recommended.  The patient has not seen Dr. Sandria Manly in many years, and I have none of those records.  Pt has hx of closed head injury in 1991.  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  There was no bleeding of the brain.  He went through 8-9 months of cognitive neurorehab.  There was no weakness associated with the injury.    03/17/13 Update:  The patient is following up today regarding his newly diagnosed Parkinson's disease.  His wife was present in the lobby, but does not accompany him to the room.  We started levodopa in August, 2014.  The patient refused therapies.  The patient does not think that the levodopa has been beneficial.  He has not had any falls.  He denies hallucinations.  He denies nausea or vomiting.  He denies lightheadedness or near syncope.  08/15/13 update:  Pt is f/u regarding PD.  He n/s his 3 month f/u.  When I last saw him in October, I increased his levodopa to 2 po tid.  Pt states at the beginning of the visit that he no longer wishes to take the medication.  "I don't think that it is worth it."  It has not helped the tremor but he agrees it may have helped stiffness.  His wife states that over the years he has become very angry.  He is tired of feeling that he has to "go along with the game."  His wife states that he will have periods of time when he doesn't shake at all (when reading) but when anxious, he shakes a lot.  He did not take levodopa this morning, and last took it about 10pm  last evening.  Unfortunately, he has continued to refuse PD related therapies.  He is on aricept for PDD.  He denies falls, hallucinations, lightheadedness, near syncope, dyskinesia.  03/16/14 update:  Pt is f/u today, accompanied by his wife who supplements the history.  Pt has levodopa resistant tremor but is on carbidopa/levodopa 25/100 for PD and is on 2 po tid.  Pt fell last week in the kitchen last week.  There was no LOC.  It was unwitnessed.  He doesn't remember how he got off of the floor and he states that he doesn't even recall if he had taken meds that day but thinks he had.  Wife states that he actually ran out of medication for a week to 10 days and was just back on medication for a short period of time.  Wife states that she "is not a good wife/caregiver" and just didn't notice walk when he was off of medication.  No hallucinations.  Pt sleeps with mattress on the floor and wife states that "I have no idea how he gets off of the floor."  Wants to look into hospital bed.  09/21/14 update:  Pt returns with wife, who supplements the hx.  Tried to refer him for in home therapy but 2  different companies tried multiple times to contact them and they didn't return their calls so he has had no therapy. His wife states that she didn't know this and the patient must have deleted the phone calls.   He doesn't think that his carbidopa/levodopa works but has refused on/off test.  He is chewing up 6 dosages of levodopa throughout the day.  His wife brings in an article about duopa and asks about this and the possibility for him.  His wife has been off of work for a month but notices more start hesitation.  She is walking with him outside every morning.    11/816 update:  Pt returns today, accompanied by his wife who supplements the history.  He is on carbidopa/levodopa 25/100, 3 in the AM, 3 in the afternoon and 2 in the evening.  He has refused a levodopa challenge to help look at efficacy of the drug as he  didn't think that levodopa was helping.  He did increase it since our last visit.  He has refused PT.  He states that he had one fall since last visit (wife knew nothing about it).  States that he slipped over slippers but he states that this fall was 9 months ago (and I last saw him 6 months ago).  He wasn't taking his synthroid and his TSH was very high so he is just getting back on that.  He states that he starts to walk and becomes anxious and thinks that anxiety pills will help.     Neuroimaging has not previously been performed.    PREVIOUS MEDICATIONS: none to date  ALLERGIES:  No Known Allergies  CURRENT MEDICATIONS:  Current Outpatient Prescriptions on File Prior to Visit  Medication Sig Dispense Refill  . carbidopa-levodopa (SINEMET IR) 25-100 MG per tablet Take 2 tablets by mouth 4 (four) times daily. 720 tablet 1  . carbidopa-levodopa (SINEMET IR) 25-100 MG per tablet Take 2 tablets by mouth 3 (three) times daily. Can take 2 extra tablets a day as needed 720 tablet 1  . donepezil (ARICEPT) 10 MG tablet Take 10 mg by mouth at bedtime.     Marland Kitchen latanoprost (XALATAN) 0.005 % ophthalmic solution Place 1 drop into both eyes at bedtime.     . simvastatin (ZOCOR) 40 MG tablet Take 40 mg by mouth daily at 6 PM.     . SYNTHROID 100 MCG tablet Take 100 mcg by mouth daily before breakfast.      No current facility-administered medications on file prior to visit.    PAST MEDICAL HISTORY:   Past Medical History  Diagnosis Date  . Closed head injury 1991  . Parkinson disease   . Hypothyroid   . Glaucoma     PAST SURGICAL HISTORY:   Past Surgical History  Procedure Laterality Date  . Partial thumb amputation      SOCIAL HISTORY:   Social History   Social History  . Marital Status: Married    Spouse Name: N/A  . Number of Children: N/A  . Years of Education: N/A   Occupational History  . Not on file.   Social History Main Topics  . Smoking status: Former Games developer  .  Smokeless tobacco: Former Neurosurgeon  . Alcohol Use: No  . Drug Use: No  . Sexual Activity: Not on file   Other Topics Concern  . Not on file   Social History Narrative    FAMILY HISTORY:   Family Status  Relation Status Death Age  .  Mother Deceased 58    CHF  . Father Deceased   . Sister Deceased     cancer    ROS:   A complete 10 system review of systems was obtained and was unremarkable apart from what is mentioned above.  PHYSICAL EXAMINATION:    VITALS:   There were no vitals filed for this visit.  GEN:  The patient appears stated age and is in NAD.   HEENT:  Normocephalic, atraumatic.  The mucous membranes are moist. The superficial temporal arteries are without ropiness or tenderness. CV:  RRR Lungs:  CTAB Neck/HEME:  There are no carotid bruits bilaterally.  Neurological examination:  Orientation: Alert and oriented x 3 today.   Cranial nerves: There is good facial symmetry. There is significant facial hypomimia.  Pupils are equal round and reactive to light bilaterally. Fundoscopic exam reveals clear margins bilaterally. Extraocular muscles are intact. The visual fields are full to confrontational testing. The speech is fluent and clear. Soft palate rises symmetrically and there is no tongue deviation. Hearing is intact to conversational tone. Sensation: Sensation is intact to light touch throughout. Motor: Strength is 5/5 in the bilateral upper and lower extremities.   Shoulder shrug is equal and symmetric.  There is no pronator drift. Deep tendon reflexes: Not tested today.  Movement examination: Tone: There is normal tone in the bilateral upper extremity today. Abnormal movements: There is a intermittent tremor of the RUE.  Tremor can be severe at times.  No tremor on the left was noted today. Coordination:  There is decremation with RAM's with all forms, including alternating supination and pronation of the forearm, hand opening and closing, finger taps, heel taps  and toe taps.  This was seen bilaterally today Gait and Station: The patient has  difficulty arising out of a deep-seated chair without the use of the hands.  He has start hesitation.  He freezes almost immediately and cannot get his feet off of the floor.  Once that happens, he has marked increase in the right upper extremity resting tremor.  Despite protest, I took his walking stick and asked him to try our walker instead.  Almost immediately, the feet were no longer frozen to the ground and he walked markedly better, although still short stepped.   ASSESSMENT/PLAN:  1.  Advanced diopathic Parkinson's disease.  The patient has tremor, bradykinesia, rigidity and postural instability.  -He again refuses a formal on/off test.  In the past, I have given him levodopa in the office in the past and did not see a huge benefit with that, but ultimately the patient decided to stay with the medication.  I'm not sure if lack of significant benefit was because I did not give it long enough (waited 30 minutes) or if I did not give a big enough dosage, or if the levodopa just did not help.    -refuses PT despite multiple attempts at talking about the benefits.  We talked about the risks of falls, including the risk of death, hip fracture, cerebral bleed.  -he was very ill tempered (and has been previously) and I offered to refer him to another neurologist for care, as I get the feeling he doesn't trust me.  He refused another referral.  -I gave him a RX for a walker and raised toilet seat.  His wife wanted to consider that but he was very resistant  -As above, the patient did markedly better when he was given a walker in our office.  With  his walking stick, his feet were frozen the large majority of the time.  Unfortunately, he is resistant to using a walker.  -He asks me for a prn anxiety medication, because when he begins to walk and he gets frozen, he gets anxious about it.  I told him that an anxiety medication  is now what he needs, but rather balance therapy to address the freezing, which is causing the anxiety.  I also told him that I thought that anxiety medication would make him more off balance.  He was very frustrated by this.  I do think he could benefit for medication to address his aggression and temperment but he doesn't want that. 2.  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  She is supposed to check and let me know.  -His medications need to be monitored.  He had stopped taking his Synthroid and his TSH markedly increased.  His wife is now monitoring the levodopa and the Synthroid.  I told both the patient and his wife that I think he needs a 24-hour per day caregiver.  His wife still works outside of the home. 3.  Pt refuses to be seen more than every 6-8 months but if changes his mind he/wife can let me know.  Total face to face time was 42 minutes, with much greater than 50% of the visit in counseling discussing safety again.

## 2015-03-26 NOTE — Patient Instructions (Signed)
1.  You need a caregiver in the home when your wife is not home 2.  I recommend that you use a walker at all times and that someone else is near you when you walk, even with the walker 3.  I am happy to refer you to another neurologist.  Please let me know if you change your mind about this 4.  I have given you RX for a walker and a raised toilet seat 5.  Let me know if you change your mind about the physical therapy that I have recommended

## 2015-08-12 ENCOUNTER — Other Ambulatory Visit: Payer: Self-pay | Admitting: Neurology

## 2015-08-12 NOTE — Telephone Encounter (Signed)
Carbidopa Levodopa refill requested. Per last office note- patient to remain on medication. Refill approved and sent to patient's pharmacy.   

## 2015-12-05 ENCOUNTER — Other Ambulatory Visit: Payer: Self-pay | Admitting: Neurology

## 2015-12-05 MED ORDER — CARBIDOPA-LEVODOPA 25-100 MG PO TABS
ORAL_TABLET | ORAL | Status: DC
Start: 2015-12-05 — End: 2015-12-23

## 2015-12-05 NOTE — Telephone Encounter (Signed)
Carbidopa Levodopa refill requested. Per last office note- patient to remain on medication. Refill approved and sent to patient's pharmacy.   

## 2015-12-20 NOTE — Progress Notes (Signed)
Stephen Cole was seen today in the movement disorders clinic for neurologic consultation at the request of Stephen Cole, [name], MD.  The consultation is for the evaluation of PD and associated memory changes.  This patient is accompanied in the office by his spouse who supplements the history.  The pt reports that sx's began about 7-8 years ago and started with R hand tremor.  He was dx with PD by Dr. Sandria Cole and no medication was recommended.  The patient has not seen Dr. Sandria Cole in many years, and I have none of those records.  Pt has hx of closed head injury in 1991.  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  There was no bleeding of the brain.  He went through 8-9 months of cognitive neurorehab.  There was no weakness associated with the injury.    03/17/13 Update:  The patient is following up today regarding his newly diagnosed Parkinson's disease.  His wife was present in the lobby, but does not accompany him to the room.  We started levodopa in August, 2014.  The patient refused therapies.  The patient does not think that the levodopa has been beneficial.  He has not had any falls.  He denies hallucinations.  He denies nausea or vomiting.  He denies lightheadedness or near syncope.  08/15/13 update:  Pt is f/u regarding PD.  He n/s his 3 month f/u.  When I last saw him in October, I increased his levodopa to 2 po tid.  Pt states at the beginning of the visit that he no longer wishes to take the medication.  "I don't think that it is worth it."  It has not helped the tremor but he agrees it may have helped stiffness.  His wife states that over the years he has become very angry.  He is tired of feeling that he has to "go along with the game."  His wife states that he will have periods of time when he doesn't shake at all (when reading) but when anxious, he shakes a lot.  He did not take levodopa this morning, and last took it about 10pm  last evening.  Unfortunately, he has continued to refuse PD related therapies.  He is on aricept for PDD.  He denies falls, hallucinations, lightheadedness, near syncope, dyskinesia.  03/16/14 update:  Pt is f/u today, accompanied by his wife who supplements the history.  Pt has levodopa resistant tremor but is on carbidopa/levodopa 25/100 for PD and is on 2 po tid.  Pt fell last week in the kitchen last week.  There was no LOC.  It was unwitnessed.  He doesn't remember how he got off of the floor and he states that he doesn't even recall if he had taken meds that day but thinks he had.  Wife states that he actually ran out of medication for a week to 10 days and was just back on medication for a short period of time.  Wife states that she "is not a good wife/caregiver" and just didn't notice walk when he was off of medication.  No hallucinations.  Pt sleeps with mattress on the floor and wife states that "I have no idea how he gets off of the floor."  Wants to look into hospital bed.  09/21/14 update:  Pt returns with wife, who supplements the hx.  Tried to refer him for in home therapy but 2  different companies tried multiple times to contact them and they didn't return their calls so he has had no therapy. His wife states that she didn't know this and the patient must have deleted the phone calls.   He doesn't think that his carbidopa/levodopa works but has refused on/off test.  He is chewing up 6 dosages of levodopa throughout the day.  His wife brings in an article about duopa and asks about this and the possibility for him.  His wife has been off of work for a month but notices more start hesitation.  She is walking with him outside every morning.    11/816 update:  Pt returns today, accompanied by his wife who supplements the history.  He is on carbidopa/levodopa 25/100, 3 in the AM, 3 in the afternoon and 2 in the evening.  He has refused a levodopa challenge to help look at efficacy of the drug as he  didn't think that levodopa was helping.  He did increase it since our last visit.  He has refused PT.  He states that he had one fall since last visit (wife knew nothing about it).  States that he slipped over slippers but he states that this fall was 9 months ago (and I last saw him 6 months ago).  He wasn't taking his synthroid and his TSH was very high so he is just getting back on that.  He states that he starts to walk and becomes anxious and thinks that anxiety pills will help.    12/23/15 update:  The patient returns today.  His wife accompanied him and gave some background history to the MA but didn't wish to be in the room with the patient.  I have not seen him since November 2016.  He has essentially refused all recommendations, including physical therapy, home modifications, levodopa challenge test, medication for mood.  Reports that he is still on carbidopa/levodopa 25/100, and he tells me different dosages when asked about how many he is on but at the end states that he is on 3 po tid.  He had 2 falls, both at night when he was getting back into the bed.  He has a nightlight.  He does not wish to have a bedside commode.  His wife has a trainer coming into the home one time a week and he states he is participating with that care but wife indicated otherwise.  There have been no hallucinations.  He took himself off of the aricept and the zocor.  He states he is still on the synthroid.     Neuroimaging has not previously been performed.    PREVIOUS MEDICATIONS: none to date  ALLERGIES:  No Known Allergies  CURRENT MEDICATIONS:  Current Outpatient Prescriptions on File Prior to Visit  Medication Sig Dispense Refill  . SYNTHROID 100 MCG tablet Take 100 mcg by mouth daily before breakfast.      No current facility-administered medications on file prior to visit.     PAST MEDICAL HISTORY:   Past Medical History:  Diagnosis Date  . Closed head injury 1991  . Glaucoma   . Hypothyroid   .  Parkinson disease (HCC)     PAST SURGICAL HISTORY:   Past Surgical History:  Procedure Laterality Date  . partial thumb amputation      SOCIAL HISTORY:   Social History   Social History  . Marital status: Married    Spouse name: N/A  . Number of children: N/A  . Years of  education: N/A   Occupational History  . Not on file.   Social History Main Topics  . Smoking status: Former Games developer  . Smokeless tobacco: Former Neurosurgeon  . Alcohol use No  . Drug use: No  . Sexual activity: Not on file   Other Topics Concern  . Not on file   Social History Narrative  . No narrative on file    FAMILY HISTORY:   Family Status  Relation Status  . Mother Deceased at age 57   CHF  . Father Deceased  . Sister Deceased   cancer    ROS:   A complete 10 system review of systems was obtained and was unremarkable apart from what is mentioned above.  PHYSICAL EXAMINATION:    VITALS:   Vitals:   12/23/15 1331  BP: (!) 180/80  BP Location: Left Arm  Patient Position: Sitting  Cuff Size: Normal  Pulse: 100  Weight: 156 lb (70.8 kg)  Height: 5\' 6"  (1.676 m)    GEN:  The patient appears stated age and is in NAD.   HEENT:  Normocephalic, atraumatic.  The mucous membranes are moist. The superficial temporal arteries are without ropiness or tenderness. CV:  RRR Lungs:  CTAB Neck/HEME:  There are no carotid bruits bilaterally.  Neurological examination:  Orientation:  Montreal Cognitive Assessment  12/23/2015  Visuospatial/ Executive (0/5) 1  Naming (0/3) 3  Attention: Read list of digits (0/2) 0  Attention: Read list of letters (0/1) 1  Attention: Serial 7 subtraction starting at 100 (0/3) 0  Language: Repeat phrase (0/2) 0  Language : Fluency (0/1) 0  Abstraction (0/2) 1  Delayed Recall (0/5) 0  Orientation (0/6) 5  Total 11  Adjusted Score (based on education) 12    Continues to be cantankerous.   Cranial nerves: There is good facial symmetry. There is significant facial  hypomimia. The visual fields are full to confrontational testing. The speech is fluent and clear. Soft palate rises symmetrically and there is no tongue deviation. Hearing is intact to conversational tone. Sensation: Sensation is intact to light touch throughout. Motor: Strength is 5/5 in the bilateral upper and lower extremities.   Shoulder shrug is equal and symmetric.  There is no pronator drift. Deep tendon reflexes: Not tested today.  Movement examination: Tone: There is mild increased tone in the RUE Abnormal movements: There is a intermittent tremor of the RUE.    No tremor on the left was noted today. Coordination:  There is decremation with RAM's with all forms, including alternating supination and pronation of the forearm, hand opening and closing, finger taps, heel taps and toe taps.  This was seen bilaterally today but R worse than the L Gait and Station: The patient has  difficulty arising out of a deep-seated chair without the use of the hands.  He has start hesitation.  He freezes almost immediately and cannot get his feet off of the floor.  He is walking with the walker.  He is short stepped with the walker and freezes a little in the doorway.  He freezes with the right foot at the turn.     ASSESSMENT/PLAN:  1.  Advanced diopathic Parkinson's disease.  The patient has tremor, bradykinesia, rigidity and postural instability.  -He again refuses a formal on/off test.  In the past, I have given him levodopa in the office in the past and did not see a huge benefit with that, but ultimately the patient decided to stay with the medication.  I'm not sure if lack of significant benefit was because I did not give it long enough (waited 30 minutes) or if I did not give a big enough dosage, or if the levodopa just did not help.    -refuses PT despite multiple attempts at talking about the benefits.  We talked about the risks of falls, including the risk of death, hip fracture, cerebral  bleed.  -he was very ill tempered (and has been previously) and I offered to refer him to another neurologist for care, as I get the feeling he doesn't trust me.  He refused another referral.  -refuses bedside commode  -am glad to see him using the walker this time  -offered change to rytary to see if that would help.  Would like to try.  Given rytary 195 mg, 3 in AM, 3 in aftenoon, 2 in the evening and d/c carbidopa/levodopa 25/100 IR. 2.  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   3. Recommend f/u in 3 months since changing medication.  Much greater than 50% of this visit was spent in counseling and coordinating care.  Total face to face time:  25 min

## 2015-12-23 ENCOUNTER — Ambulatory Visit (INDEPENDENT_AMBULATORY_CARE_PROVIDER_SITE_OTHER): Payer: Medicare Other | Admitting: Neurology

## 2015-12-23 ENCOUNTER — Encounter: Payer: Self-pay | Admitting: Neurology

## 2015-12-23 VITALS — BP 180/80 | HR 100 | Ht 66.0 in | Wt 156.0 lb

## 2015-12-23 DIAGNOSIS — F0281 Dementia in other diseases classified elsewhere with behavioral disturbance: Secondary | ICD-10-CM

## 2015-12-23 DIAGNOSIS — G20 Parkinson's disease: Secondary | ICD-10-CM | POA: Diagnosis not present

## 2015-12-23 MED ORDER — CARBIDOPA-LEVODOPA ER 48.75-195 MG PO CPCR: 1.0000 | ORAL_CAPSULE | Freq: Every day | ORAL | 0 refills | Status: DC

## 2015-12-23 NOTE — Patient Instructions (Signed)
1. Stop Carbidopa Levodopa. 2. Start Rytary 195 mg - take 3 tablets in the morning, 2 tablets in the afternoon, 2 tablets in the evening. Let us know if this is working and we will send in a prescription.

## 2016-01-03 ENCOUNTER — Telehealth: Payer: Self-pay | Admitting: Neurology

## 2016-01-03 MED ORDER — CARBIDOPA-LEVODOPA ER 48.75-195 MG PO CPCR: 1.0000 | ORAL_CAPSULE | Freq: Every day | ORAL | 2 refills | Status: DC

## 2016-01-03 NOTE — Telephone Encounter (Signed)
Patient doing really well on Rytary. He is walking better and able to move around easier. No side effects. Prescription sent to pharmacy.

## 2016-01-03 NOTE — Telephone Encounter (Signed)
Stephen KalataFrancis Kargbo 05/29/2032. His wife called and said the Rytary sample is working and she would like to order the medication.  She would like to speak  with you about it. Her # is [phone]. Thank you

## 2016-01-06 ENCOUNTER — Telehealth: Payer: Self-pay | Admitting: Neurology

## 2016-01-06 MED ORDER — CARBIDOPA-LEVODOPA ER 48.75-195 MG PO CPCR: 1.0000 | ORAL_CAPSULE | Freq: Every day | ORAL | 2 refills | Status: DC

## 2016-01-06 NOTE — Telephone Encounter (Signed)
PT's wife called in regards to his medication/Dawn CB# [phone]/Dawn

## 2016-01-06 NOTE — Telephone Encounter (Signed)
Spoke with patient's wife. RX did not get to the pharmacy. Prescription wasn't changed from "sample" to "normal" so it did not reach the pharmacy. RX sent.

## 2016-01-16 DIAGNOSIS — H401111 Primary open-angle glaucoma, right eye, mild stage: Secondary | ICD-10-CM | POA: Diagnosis not present

## 2016-04-23 ENCOUNTER — Ambulatory Visit: Payer: Medicare Other | Admitting: Neurology

## 2016-04-24 NOTE — Progress Notes (Deleted)
Stephen Cole was seen today in the movement disorders clinic for neurologic consultation at the request of [name], [name], MD.  The consultation is for the evaluation of PD and associated memory changes.  This patient is accompanied in the office by his spouse who supplements the history.  The pt reports that sx's began about 7-8 years ago and started with R hand tremor.  He was dx with PD by Dr. [name] and no medication was recommended.  The patient has not seen Dr. [name] in many years, and I have none of those records.  Pt has hx of closed head injury in 1991.  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  There was no bleeding of the brain.  He went through 8-9 months of cognitive neurorehab.  There was no weakness associated with the injury.    03/17/13 Update:  The patient is following up today regarding his newly diagnosed Parkinson's disease.  His wife was present in the lobby, but does not accompany him to the room.  We started levodopa in August, 2014.  The patient refused therapies.  The patient does not think that the levodopa has been beneficial.  He has not had any falls.  He denies hallucinations.  He denies nausea or vomiting.  He denies lightheadedness or near syncope.  08/15/13 update:  Pt is f/u regarding PD.  He n/s his 3 month f/u.  When I last saw him in October, I increased his levodopa to 2 po tid.  Pt states at the beginning of the visit that he no longer wishes to take the medication.  "I don't think that it is worth it."  It has not helped the tremor but he agrees it may have helped stiffness.  His wife states that over the years he has become very angry.  He is tired of feeling that he has to "go along with the game."  His wife states that he will have periods of time when he doesn't shake at all (when reading) but when anxious, he shakes a lot.  He did not take levodopa this morning, and last took it about 10pm  last evening.  Unfortunately, he has continued to refuse PD related therapies.  He is on aricept for PDD.  He denies falls, hallucinations, lightheadedness, near syncope, dyskinesia.  03/16/14 update:  Pt is f/u today, accompanied by his wife who supplements the history.  Pt has levodopa resistant tremor but is on carbidopa/levodopa 25/100 for PD and is on 2 po tid.  Pt fell last week in the kitchen last week.  There was no LOC.  It was unwitnessed.  He doesn't remember how he got off of the floor and he states that he doesn't even recall if he had taken meds that day but thinks he had.  Wife states that he actually ran out of medication for a week to 10 days and was just back on medication for a short period of time.  Wife states that she "is not a good wife/caregiver" and just didn't notice walk when he was off of medication.  No hallucinations.  Pt sleeps with mattress on the floor and wife states that "I have no idea how he gets off of the floor."  Wants to look into hospital bed.  09/21/14 update:  Pt returns with wife, who supplements the hx.  Tried to refer him for in home therapy but 2  different companies tried multiple times to contact them and they didn't return their calls so he has had no therapy. His wife states that she didn't know this and the patient must have deleted the phone calls.   He doesn't think that his carbidopa/levodopa works but has refused on/off test.  He is chewing up 6 dosages of levodopa throughout the day.  His wife brings in an article about duopa and asks about this and the possibility for him.  His wife has been off of work for a month but notices more start hesitation.  She is walking with him outside every morning.    11/816 update:  Pt returns today, accompanied by his wife who supplements the history.  He is on carbidopa/levodopa 25/100, 3 in the AM, 3 in the afternoon and 2 in the evening.  He has refused a levodopa challenge to help look at efficacy of the drug as he  didn't think that levodopa was helping.  He did increase it since our last visit.  He has refused PT.  He states that he had one fall since last visit (wife knew nothing about it).  States that he slipped over slippers but he states that this fall was 9 months ago (and I last saw him 6 months ago).  He wasn't taking his synthroid and his TSH was very high so he is just getting back on that.  He states that he starts to walk and becomes anxious and thinks that anxiety pills will help.    12/23/15 update:  The patient returns today.  His wife accompanied him and gave some background history to the MA but didn't wish to be in the room with the patient.  I have not seen him since November 2016.  He has essentially refused all recommendations, including physical therapy, home modifications, levodopa challenge test, medication for mood.  Reports that he is still on carbidopa/levodopa 25/100, and he tells me different dosages when asked about how many he is on but at the end states that he is on 3 po tid.  He had 2 falls, both at night when he was getting back into the bed.  He has a nightlight.  He does not wish to have a bedside commode.  His wife has a trainer coming into the home one time a week and he states he is participating with that care but wife indicated otherwise.  There have been no hallucinations.  He took himself off of the aricept and the zocor.  He states he is still on the synthroid.    04/27/16 update:  The patient returns today, accompanied by his wife who supplements the history.  Last visit, I changed him to Rytary, 195 mg, 3 tablets in the morning, 3 in the afternoon and 2 in the evening.   Neuroimaging has not previously been performed.    PREVIOUS MEDICATIONS: none to date  ALLERGIES:  No Known Allergies  CURRENT MEDICATIONS:  Current Outpatient Prescriptions on File Prior to Visit  Medication Sig Dispense Refill  . Carbidopa-Levodopa ER (RYTARY) 48.75-195 MG CPCR Take 1 tablet by  mouth daily. Take 3 in the morning, 2 in the afternoon, 2 in the evening (ignore one tablet daily) 210 capsule 2  . SYNTHROID 100 MCG tablet Take 100 mcg by mouth daily before breakfast.      No current facility-administered medications on file prior to visit.     PAST MEDICAL HISTORY:   Past Medical History:  Diagnosis Date  .  Closed head injury 1991  . Glaucoma   . Hypothyroid   . Parkinson disease (HCC)     PAST SURGICAL HISTORY:   Past Surgical History:  Procedure Laterality Date  . partial thumb amputation      SOCIAL HISTORY:   Social History   Social History  . Marital status: Married    Spouse name: N/A  . Number of children: N/A  . Years of education: N/A   Occupational History  . Not on file.   Social History Main Topics  . Smoking status: Former Games developermoker  . Smokeless tobacco: Former NeurosurgeonUser  . Alcohol use No  . Drug use: No  . Sexual activity: Not on file   Other Topics Concern  . Not on file   Social History Narrative  . No narrative on file    FAMILY HISTORY:   Family Status  Relation Status  . Mother Deceased at age 80   CHF  . Father Deceased  . Sister Deceased   cancer    ROS:   A complete 10 system review of systems was obtained and was unremarkable apart from what is mentioned above.  PHYSICAL EXAMINATION:    VITALS:   There were no vitals filed for this visit.  GEN:  The patient appears stated age and is in NAD.   HEENT:  Normocephalic, atraumatic.  The mucous membranes are moist. The superficial temporal arteries are without ropiness or tenderness. CV:  RRR Lungs:  CTAB Neck/HEME:  There are no carotid bruits bilaterally.  Neurological examination:  Orientation:  Montreal Cognitive Assessment  12/23/2015  Visuospatial/ Executive (0/5) 1  Naming (0/3) 3  Attention: Read list of digits (0/2) 0  Attention: Read list of letters (0/1) 1  Attention: Serial 7 subtraction starting at 100 (0/3) 0  Language: Repeat phrase (0/2) 0   Language : Fluency (0/1) 0  Abstraction (0/2) 1  Delayed Recall (0/5) 0  Orientation (0/6) 5  Total 11  Adjusted Score (based on education) 12    Continues to be cantankerous.   Cranial nerves: There is good facial symmetry. There is significant facial hypomimia. The visual fields are full to confrontational testing. The speech is fluent and clear. Soft palate rises symmetrically and there is no tongue deviation. Hearing is intact to conversational tone. Sensation: Sensation is intact to light touch throughout. Motor: Strength is 5/5 in the bilateral upper and lower extremities.   Shoulder shrug is equal and symmetric.  There is no pronator drift. Deep tendon reflexes: Not tested today.  Movement examination: Tone: There is mild increased tone in the RUE Abnormal movements: There is a intermittent tremor of the RUE.    No tremor on the left was noted today. Coordination:  There is decremation with RAM's with all forms, including alternating supination and pronation of the forearm, hand opening and closing, finger taps, heel taps and toe taps.  This was seen bilaterally today but R worse than the L Gait and Station: The patient has  difficulty arising out of a deep-seated chair without the use of the hands.  He has start hesitation.  He freezes almost immediately and cannot get his feet off of the floor.  He is walking with the walker.  He is short stepped with the walker and freezes a little in the doorway.  He freezes with the right foot at the turn.     ASSESSMENT/PLAN:  1.  Advanced idiopathic Parkinson's disease.  The patient has tremor, bradykinesia, rigidity and postural  instability.  -He again refuses a formal on/off test.  In the past, I have given him levodopa in the office in the past and did not see a huge benefit with that, but ultimately the patient decided to stay with the medication.  I'm not sure if lack of significant benefit was because I did not give it long enough (waited  30 minutes) or if I did not give a big enough dosage, or if the levodopa just did not help.    -refuses PT despite multiple attempts at talking about the benefits.  We talked about the risks of falls, including the risk of death, hip fracture, cerebral bleed.  -he was very ill tempered (and has been previously) and I offered to refer him to another neurologist for care, as I get the feeling he doesn't trust me.  He refused another referral.  -refuses bedside commode  -am glad to see him using the walker this time  -Continue rytary 195 mg, 3 in AM, 3 in aftenoon, 2 in the evening  2.  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   3. Recommend f/u in 3 months since changing medication.  Much greater than 50% of this visit was spent in counseling and coordinating care.  Total face to face time:  25 min

## 2016-04-27 ENCOUNTER — Ambulatory Visit: Payer: Medicare Other | Admitting: Neurology

## 2016-04-28 ENCOUNTER — Encounter: Payer: Self-pay | Admitting: Neurology

## 2016-04-28 ENCOUNTER — Ambulatory Visit: Payer: Medicare Other | Admitting: Neurology

## 2016-04-28 NOTE — Progress Notes (Deleted)
Stephen KalataFrancis Cole was seen today in the movement disorders clinic for neurologic consultation at the request of Stephen Cole, [name], MD.  The consultation is for the evaluation of PD and associated memory changes.  This patient is accompanied in the office by his spouse who supplements the history.  The pt reports that sx's began about 7-8 years ago and started with R hand tremor.  He was dx with PD by Dr. Sandria Cole and no medication was recommended.  The patient has not seen Dr. Sandria Cole in many years, and I have none of those records.  Pt has hx of closed head injury in 1991.  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  There was no bleeding of the brain.  He went through 8-9 months of cognitive neurorehab.  There was no weakness associated with the injury.    03/17/13 Update:  The patient is following up today regarding his newly diagnosed Parkinson's disease.  His wife was present in the lobby, but does not accompany him to the room.  We started levodopa in August, 2014.  The patient refused therapies.  The patient does not think that the levodopa has been beneficial.  He has not had any falls.  He denies hallucinations.  He denies nausea or vomiting.  He denies lightheadedness or near syncope.  08/15/13 update:  Pt is f/u regarding PD.  He n/s his 3 month f/u.  When I last saw him in October, I increased his levodopa to 2 po tid.  Pt states at the beginning of the visit that he no longer wishes to take the medication.  "I don't think that it is worth it."  It has not helped the tremor but he agrees it may have helped stiffness.  His wife states that over the years he has become very angry.  He is tired of feeling that he has to "go along with the game."  His wife states that he will have periods of time when he doesn't shake at all (when reading) but when anxious, he shakes a lot.  He did not take levodopa this morning, and last took it about 10pm  last evening.  Unfortunately, he has continued to refuse PD related therapies.  He is on aricept for PDD.  He denies falls, hallucinations, lightheadedness, near syncope, dyskinesia.  03/16/14 update:  Pt is f/u today, accompanied by his wife who supplements the history.  Pt has levodopa resistant tremor but is on carbidopa/levodopa 25/100 for PD and is on 2 po tid.  Pt fell last week in the kitchen last week.  There was no LOC.  It was unwitnessed.  He doesn't remember how he got off of the floor and he states that he doesn't even recall if he had taken meds that day but thinks he had.  Wife states that he actually ran out of medication for a week to 10 days and was just back on medication for a short period of time.  Wife states that she "is not a good wife/caregiver" and just didn't notice walk when he was off of medication.  No hallucinations.  Pt sleeps with mattress on the floor and wife states that "I have no idea how he gets off of the floor."  Wants to look into hospital bed.  09/21/14 update:  Pt returns with wife, who supplements the hx.  Tried to refer him for in home therapy but 2  different companies tried multiple times to contact them and they didn't return their calls so he has had no therapy. His wife states that she didn't know this and the patient must have deleted the phone calls.   He doesn't think that his carbidopa/levodopa works but has refused on/off test.  He is chewing up 6 dosages of levodopa throughout the day.  His wife brings in an article about duopa and asks about this and the possibility for him.  His wife has been off of work for a month but notices more start hesitation.  She is walking with him outside every morning.    11/816 update:  Pt returns today, accompanied by his wife who supplements the history.  He is on carbidopa/levodopa 25/100, 3 in the AM, 3 in the afternoon and 2 in the evening.  He has refused a levodopa challenge to help look at efficacy of the drug as he  didn't think that levodopa was helping.  He did increase it since our last visit.  He has refused PT.  He states that he had one fall since last visit (wife knew nothing about it).  States that he slipped over slippers but he states that this fall was 9 months ago (and I last saw him 6 months ago).  He wasn't taking his synthroid and his TSH was very high so he is just getting back on that.  He states that he starts to walk and becomes anxious and thinks that anxiety pills will help.    12/23/15 update:  The patient returns today.  His wife accompanied him and gave some background history to the MA but didn't wish to be in the room with the patient.  I have not seen him since November 2016.  He has essentially refused all recommendations, including physical therapy, home modifications, levodopa challenge test, medication for mood.  Reports that he is still on carbidopa/levodopa 25/100, and he tells me different dosages when asked about how many he is on but at the end states that he is on 3 po tid.  He had 2 falls, both at night when he was getting back into the bed.  He has a nightlight.  He does not wish to have a bedside commode.  His wife has a trainer coming into the home one time a week and he states he is participating with that care but wife indicated otherwise.  There have been no hallucinations.  He took himself off of the aricept and the zocor.  He states he is still on the synthroid.    04/28/16 update:  The patient returns today, accompanied by his wife who supplements the history.  Last visit, I changed him to Rytary, 195 mg, 3 tablets in the morning, 3 in the afternoon and 2 in the evening.   Neuroimaging has not previously been performed.    PREVIOUS MEDICATIONS: none to date  ALLERGIES:  No Known Allergies  CURRENT MEDICATIONS:  Current Outpatient Prescriptions on File Prior to Visit  Medication Sig Dispense Refill  . Carbidopa-Levodopa ER (RYTARY) 48.75-195 MG CPCR Take 1 tablet by  mouth daily. Take 3 in the morning, 2 in the afternoon, 2 in the evening (ignore one tablet daily) 210 capsule 2  . SYNTHROID 100 MCG tablet Take 100 mcg by mouth daily before breakfast.      No current facility-administered medications on file prior to visit.     PAST MEDICAL HISTORY:   Past Medical History:  Diagnosis Date  .  Closed head injury 1991  . Glaucoma   . Hypothyroid   . Parkinson disease (HCC)     PAST SURGICAL HISTORY:   Past Surgical History:  Procedure Laterality Date  . partial thumb amputation      SOCIAL HISTORY:   Social History   Social History  . Marital status: Married    Spouse name: N/A  . Number of children: N/A  . Years of education: N/A   Occupational History  . Not on file.   Social History Main Topics  . Smoking status: Former Games developermoker  . Smokeless tobacco: Former NeurosurgeonUser  . Alcohol use No  . Drug use: No  . Sexual activity: Not on file   Other Topics Concern  . Not on file   Social History Narrative  . No narrative on file    FAMILY HISTORY:   Family Status  Relation Status  . Mother Deceased at age 80   CHF  . Father Deceased  . Sister Deceased   cancer    ROS:   A complete 10 system review of systems was obtained and was unremarkable apart from what is mentioned above.  PHYSICAL EXAMINATION:    VITALS:   There were no vitals filed for this visit.  GEN:  The patient appears stated age and is in NAD.   HEENT:  Normocephalic, atraumatic.  The mucous membranes are moist. The superficial temporal arteries are without ropiness or tenderness. CV:  RRR Lungs:  CTAB Neck/HEME:  There are no carotid bruits bilaterally.  Neurological examination:  Orientation:  Montreal Cognitive Assessment  12/23/2015  Visuospatial/ Executive (0/5) 1  Naming (0/3) 3  Attention: Read list of digits (0/2) 0  Attention: Read list of letters (0/1) 1  Attention: Serial 7 subtraction starting at 100 (0/3) 0  Language: Repeat phrase (0/2) 0   Language : Fluency (0/1) 0  Abstraction (0/2) 1  Delayed Recall (0/5) 0  Orientation (0/6) 5  Total 11  Adjusted Score (based on education) 12    Continues to be cantankerous.   Cranial nerves: There is good facial symmetry. There is significant facial hypomimia. The visual fields are full to confrontational testing. The speech is fluent and clear. Soft palate rises symmetrically and there is no tongue deviation. Hearing is intact to conversational tone. Sensation: Sensation is intact to light touch throughout. Motor: Strength is 5/5 in the bilateral upper and lower extremities.   Shoulder shrug is equal and symmetric.  There is no pronator drift. Deep tendon reflexes: Not tested today.  Movement examination: Tone: There is mild increased tone in the RUE Abnormal movements: There is a intermittent tremor of the RUE.    No tremor on the left was noted today. Coordination:  There is decremation with RAM's with all forms, including alternating supination and pronation of the forearm, hand opening and closing, finger taps, heel taps and toe taps.  This was seen bilaterally today but R worse than the L Gait and Station: The patient has  difficulty arising out of a deep-seated chair without the use of the hands.  He has start hesitation.  He freezes almost immediately and cannot get his feet off of the floor.  He is walking with the walker.  He is short stepped with the walker and freezes a little in the doorway.  He freezes with the right foot at the turn.     ASSESSMENT/PLAN:  1.  Advanced idiopathic Parkinson's disease.  The patient has tremor, bradykinesia, rigidity and postural  instability.  -He again refuses a formal on/off test.  In the past, I have given him levodopa in the office in the past and did not see a huge benefit with that, but ultimately the patient decided to stay with the medication.  I'm not sure if lack of significant benefit was because I did not give it long enough (waited  30 minutes) or if I did not give a big enough dosage, or if the levodopa just did not help.    -refuses PT despite multiple attempts at talking about the benefits.  We talked about the risks of falls, including the risk of death, hip fracture, cerebral bleed.  -he was very ill tempered (and has been previously) and I offered to refer him to another neurologist for care, as I get the feeling he doesn't trust me.  He refused another referral.  -refuses bedside commode  -am glad to see him using the walker this time  -Continue rytary 195 mg, 3 in AM, 3 in aftenoon, 2 in the evening  2.  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   3. Recommend f/u in 3 months since changing medication.  Much greater than 50% of this visit was spent in counseling and coordinating care.  Total face to face time:  25 min

## 2016-04-30 ENCOUNTER — Telehealth: Payer: Self-pay | Admitting: Neurology

## 2016-04-30 ENCOUNTER — Other Ambulatory Visit: Payer: Self-pay | Admitting: Neurology

## 2016-04-30 NOTE — Telephone Encounter (Signed)
Patient dismissed from [facility] by Lurena Joinerebecca Tat DO , effective April 28, 2016. Dismissal letter sent out by certified / registered mail. DAJ

## 2016-05-01 NOTE — Telephone Encounter (Signed)
Please advise on refill since patient d/c.

## 2016-05-01 NOTE — Telephone Encounter (Signed)
Can have 30 day supply with 1 refill (only have to do 30 day supply but I know it will take a while to get into new neurologist).  Make them aware that this will be last from us.

## 2016-05-06 NOTE — Telephone Encounter (Signed)
Received signed domestic return receipt verifying delivery of certified letter on May 02, 2016. Article number [id number] [phone] [id number] DAJ

## 2016-05-22 DIAGNOSIS — G20 Parkinson's disease: Secondary | ICD-10-CM | POA: Diagnosis not present

## 2016-05-22 DIAGNOSIS — R9721 Rising PSA following treatment for malignant neoplasm of prostate: Secondary | ICD-10-CM | POA: Diagnosis not present

## 2016-05-22 DIAGNOSIS — M25511 Pain in right shoulder: Secondary | ICD-10-CM | POA: Diagnosis not present

## 2016-05-22 DIAGNOSIS — F0390 Unspecified dementia without behavioral disturbance: Secondary | ICD-10-CM | POA: Diagnosis not present

## 2016-05-22 DIAGNOSIS — Z6825 Body mass index (BMI) 25.0-25.9, adult: Secondary | ICD-10-CM | POA: Diagnosis not present

## 2016-05-22 DIAGNOSIS — I10 Essential (primary) hypertension: Secondary | ICD-10-CM | POA: Diagnosis not present

## 2016-05-22 DIAGNOSIS — R634 Abnormal weight loss: Secondary | ICD-10-CM | POA: Diagnosis not present

## 2016-05-22 DIAGNOSIS — E038 Other specified hypothyroidism: Secondary | ICD-10-CM | POA: Diagnosis not present

## 2016-05-22 DIAGNOSIS — R296 Repeated falls: Secondary | ICD-10-CM | POA: Diagnosis not present

## 2016-05-22 DIAGNOSIS — F329 Major depressive disorder, single episode, unspecified: Secondary | ICD-10-CM | POA: Diagnosis not present

## 2016-06-02 ENCOUNTER — Ambulatory Visit: Payer: Medicare Other | Admitting: Neurology

## 2016-06-03 ENCOUNTER — Encounter (HOSPITAL_COMMUNITY): Payer: Self-pay

## 2016-06-03 ENCOUNTER — Emergency Department (HOSPITAL_COMMUNITY)
Admission: EM | Admit: 2016-06-03 | Discharge: 2016-06-03 | Disposition: A | Discharge status: Home / Self Care | Payer: Medicare Other | Attending: Emergency Medicine | Admitting: Emergency Medicine

## 2016-06-03 ENCOUNTER — Emergency Department (HOSPITAL_COMMUNITY): Payer: Medicare Other

## 2016-06-03 DIAGNOSIS — G20 Parkinson's disease: Secondary | ICD-10-CM | POA: Insufficient documentation

## 2016-06-03 DIAGNOSIS — E039 Hypothyroidism, unspecified: Secondary | ICD-10-CM | POA: Diagnosis not present

## 2016-06-03 DIAGNOSIS — K5641 Fecal impaction: Secondary | ICD-10-CM | POA: Diagnosis not present

## 2016-06-03 DIAGNOSIS — N400 Enlarged prostate without lower urinary tract symptoms: Secondary | ICD-10-CM

## 2016-06-03 DIAGNOSIS — Z87891 Personal history of nicotine dependence: Secondary | ICD-10-CM | POA: Insufficient documentation

## 2016-06-03 DIAGNOSIS — N401 Enlarged prostate with lower urinary tract symptoms: Secondary | ICD-10-CM | POA: Insufficient documentation

## 2016-06-03 DIAGNOSIS — R1032 Left lower quadrant pain: Secondary | ICD-10-CM | POA: Diagnosis not present

## 2016-06-03 DIAGNOSIS — R339 Retention of urine, unspecified: Secondary | ICD-10-CM | POA: Diagnosis not present

## 2016-06-03 DIAGNOSIS — K5900 Constipation, unspecified: Secondary | ICD-10-CM | POA: Diagnosis not present

## 2016-06-03 DIAGNOSIS — N3001 Acute cystitis with hematuria: Secondary | ICD-10-CM | POA: Insufficient documentation

## 2016-06-03 LAB — CBC WITH DIFFERENTIAL/PLATELET
BASOS ABS: 0 10*3/uL (ref 0.0–0.1)
Basophils Relative: 0 %
EOS ABS: 0 10*3/uL (ref 0.0–0.7)
EOS PCT: 0 %
HCT: 44.6 % (ref 39.0–52.0)
Hemoglobin: 15.2 g/dL (ref 13.0–17.0)
LYMPHS ABS: 3.5 10*3/uL (ref 0.7–4.0)
Lymphocytes Relative: 12 %
MCH: 31.6 pg (ref 26.0–34.0)
MCHC: 34.1 g/dL (ref 30.0–36.0)
MCV: 92.7 fL (ref 78.0–100.0)
MONO ABS: 1.5 10*3/uL — AB (ref 0.1–1.0)
Monocytes Relative: 5 %
NEUTROS PCT: 83 %
Neutro Abs: 24.1 10*3/uL — ABNORMAL HIGH (ref 1.7–7.7)
PLATELETS: 417 10*3/uL — AB (ref 150–400)
RBC: 4.81 MIL/uL (ref 4.22–5.81)
RDW: 12.8 % (ref 11.5–15.5)
WBC: 29.1 10*3/uL — AB (ref 4.0–10.5)

## 2016-06-03 LAB — BASIC METABOLIC PANEL
Anion gap: 12 (ref 5–15)
BUN: 34 mg/dL — AB (ref 6–20)
CHLORIDE: 95 mmol/L — AB (ref 101–111)
CO2: 25 mmol/L (ref 22–32)
CREATININE: 1.8 mg/dL — AB (ref 0.61–1.24)
Calcium: 9 mg/dL (ref 8.9–10.3)
GFR, EST AFRICAN AMERICAN: 39 mL/min — AB (ref >60)
GFR, EST NON AFRICAN AMERICAN: 34 mL/min — AB (ref >60)
Glucose, Bld: 117 mg/dL — ABNORMAL HIGH (ref 65–99)
Potassium: 4.7 mmol/L (ref 3.5–5.1)
SODIUM: 132 mmol/L — AB (ref 135–145)

## 2016-06-03 LAB — URINALYSIS, ROUTINE W REFLEX MICROSCOPIC
BILIRUBIN URINE: NEGATIVE
GLUCOSE, UA: NEGATIVE mg/dL
KETONES UR: 5 mg/dL — AB
LEUKOCYTES UA: NEGATIVE
NITRITE: NEGATIVE
PH: 5 (ref 5.0–8.0)
Protein, ur: 30 mg/dL — AB
SPECIFIC GRAVITY, URINE: 1.017 (ref 1.005–1.030)

## 2016-06-03 MED ORDER — DEXTROSE 5 % IV SOLN
1.0000 g | Freq: Once | INTRAVENOUS | Status: AC
  Administered 2016-06-03: 1 g via INTRAVENOUS
  Filled 2016-06-03: qty 10

## 2016-06-03 MED ORDER — AMOXICILLIN-POT CLAVULANATE 875-125 MG PO TABS: 1.0000 | ORAL_TABLET | Freq: Two times a day (BID) | ORAL | 0 refills | Status: DC

## 2016-06-03 MED ORDER — IOPAMIDOL (ISOVUE-300) INJECTION 61%
INTRAVENOUS | Status: AC
  Administered 2016-06-03: 30 mL via ORAL
  Filled 2016-06-03: qty 30

## 2016-06-03 MED ORDER — AMOXICILLIN-POT CLAVULANATE 875-125 MG PO TABS
1.0000 | ORAL_TABLET | Freq: Once | ORAL | Status: AC
  Administered 2016-06-03: 1 via ORAL
  Filled 2016-06-03: qty 1

## 2016-06-03 MED ORDER — SODIUM CHLORIDE 0.9 % IV BOLUS (SEPSIS)
500.0000 mL | Freq: Once | INTRAVENOUS | Status: AC
  Administered 2016-06-03: 500 mL via INTRAVENOUS

## 2016-06-03 MED ORDER — POLYETHYLENE GLYCOL 3350 17 GM/SCOOP PO POWD: 17.0000 g | Freq: Two times a day (BID) | ORAL | 0 refills | Status: AC

## 2016-06-03 MED ORDER — IOPAMIDOL (ISOVUE-300) INJECTION 61%
30.0000 mL | Freq: Once | INTRAVENOUS | Status: AC | PRN
  Administered 2016-06-03: 30 mL via ORAL

## 2016-06-03 NOTE — ED Triage Notes (Signed)
Per EMS, pt from home.  Pt c/o constipation.  Has not had bm for a few days.  Pt just getting over the flu.  Normally can walk with walker.  Having some abdominal pain.  Vitals:  132/74, hr 102, resp 14, 99% ra

## 2016-06-03 NOTE — Discharge Instructions (Signed)
Read the information below.  You may return to the Emergency Department at any time for worsening condition or any new symptoms that concern you.  If you develop high fevers, worsening abdominal pain, uncontrolled vomiting, or are unable to tolerate fluids by mouth, return to the ER for a recheck.   °

## 2016-06-03 NOTE — ED Provider Notes (Signed)
Patient care assumed from [facility], PA-C at shift change. Please see her note for further.  Briefly, patient presented with constipation, stool impaction and inability to urinate. Patient was disimpacted by Trixie Dredge. Foley cath placed for urinary retention. At shift change patient is awaiting CT scan of his abdomen and pelvis.  CT abdomen and pelvis shows patchy airspace disease in the lingula suspicious for pneumonia. There is also right-sided small bowel fat-containing inguinal hernia without bowel obstruction. There is also prostate enlargement a Foley balloon in decompressed bladder. There is increased colonic stool retention within the large bowel consistent with constipation to the level of the rectum.  Patient received soapsuds enema and had a successful large bowel movement. He is able to [place] with assistance in the room. Wife reports that he normally ambulates with a walker. This is around his baseline.  Patient was discussed with Dr. Effie Shy who would like the patient treated for urinary tract infection and his pneumonia with Augmentin. Patient received his first dose in the emergency department tonight. We'll also encourage him to take MiraLAX twice daily to improve his bowel movements. We'll have him follow-up with urology and primary care. Patient sent home with Foley cath and leg bag. I discussed strict and specific return precautions. I advised the patient to follow-up with their primary care provider this week. I advised the patient to return to the emergency department with new or worsening symptoms or new concerns. The patient and his wife verbalized understanding and agreement with plan.    Results for orders placed or performed during the hospital encounter of 06/03/16  Basic metabolic panel  Result Value Ref Range   Sodium 132 (L) 135 - 145 mmol/L   Potassium 4.7 3.5 - 5.1 mmol/L   Chloride 95 (L) 101 - 111 mmol/L   CO2 25 22 - 32 mmol/L   Glucose, Bld 117 (H) 65 - 99 mg/dL    BUN 34 (H) 6 - 20 mg/dL   Creatinine, Ser 4.09 (H) 0.61 - 1.24 mg/dL   Calcium 9.0 8.9 - 81.1 mg/dL   GFR calc non Af Amer 34 (L) >60 mL/min   GFR calc Af Amer 39 (L) >60 mL/min   Anion gap 12 5 - 15  CBC with Differential  Result Value Ref Range   WBC 29.1 (H) 4.0 - 10.5 K/uL   RBC 4.81 4.22 - 5.81 MIL/uL   Hemoglobin 15.2 13.0 - 17.0 g/dL   HCT 91.4 78.2 - 95.6 %   MCV 92.7 78.0 - 100.0 fL   MCH 31.6 26.0 - 34.0 pg   MCHC 34.1 30.0 - 36.0 g/dL   RDW 21.3 08.6 - 57.8 %   Platelets 417 (H) 150 - 400 K/uL   Neutrophils Relative % 83 %   Lymphocytes Relative 12 %   Monocytes Relative 5 %   Eosinophils Relative 0 %   Basophils Relative 0 %   Neutro Abs 24.1 (H) 1.7 - 7.7 K/uL   Lymphs Abs 3.5 0.7 - 4.0 K/uL   Monocytes Absolute 1.5 (H) 0.1 - 1.0 K/uL   Eosinophils Absolute 0.0 0.0 - 0.7 K/uL   Basophils Absolute 0.0 0.0 - 0.1 K/uL   WBC Morphology WHITE COUNT CONFIRMED ON SMEAR   Urinalysis, Routine w reflex microscopic  Result Value Ref Range   Color, Urine YELLOW YELLOW   APPearance HAZY (A) CLEAR   Specific Gravity, Urine 1.017 1.005 - 1.030   pH 5.0 5.0 - 8.0   Glucose, UA NEGATIVE NEGATIVE  mg/dL   Hgb urine dipstick LARGE (A) NEGATIVE   Bilirubin Urine NEGATIVE NEGATIVE   Ketones, ur 5 (A) NEGATIVE mg/dL   Protein, ur 30 (A) NEGATIVE mg/dL   Nitrite NEGATIVE NEGATIVE   Leukocytes, UA NEGATIVE NEGATIVE   RBC / HPF TOO NUMEROUS TO COUNT 0 - 5 RBC/hpf   WBC, UA 6-30 0 - 5 WBC/hpf   Bacteria, UA RARE (A) NONE SEEN   Squamous Epithelial / LPF 0-5 (A) NONE SEEN   Mucous PRESENT    Ct Abdomen Pelvis Wo Contrast  Result Date: 06/03/2016 CLINICAL DATA:  Lower abdominal pain, constipation, difficulty urinating. Flu like symptoms. EXAM: CT ABDOMEN AND PELVIS WITHOUT CONTRAST TECHNIQUE: Multidetector CT imaging of the abdomen and pelvis was performed following the standard protocol without IV contrast. COMPARISON:  None. FINDINGS: Lower chest: Patchy airspace disease and  atelectasis in the lingula suspicious for pneumonia. Bibasilar atelectasis. Coronary arteriosclerosis noted of the top-normal sized heart. No pericardial effusion. Hepatobiliary: Uncomplicated cholelithiasis with single 2 cm gallstone noted in the body of the gallbladder. No wall thickening nor pericholecystic fluid. Unremarkable unenhanced liver. No biliary dilatation. Pancreas: Normal Spleen: Normal Adrenals/Urinary Tract: No obstructive uropathy. Normal adrenal glands bilaterally. Urinary bladder is partially decompressed by Foley catheter. No nephrolithiasis. Stomach/Bowel: Stomach is moderately distended with oral contrast. No small bowel dilatation. Right inguinal hernia containing loops of ileum go. Increased colonic stool retention within large bowel consistent with constipation to the level of the rectum. Vascular/Lymphatic: Aortoiliac atherosclerosis.  No lymphadenopathy. Reproductive: Marked prostatic enlargement measuring approximately 8.3 x 6.8 x 6.5 cm (volume = 190 cm^3). Other: Small umbilical fat containing hernia. No ascites or free air. Musculoskeletal: Lumbar spondylosis. Mild compression of L1 likely chronic without retropulsion. Multilevel degenerative disc disease noted from L1 through L5. Schmorl's nodes noted at L1 and L2. IMPRESSION: 1. Patchy airspace disease in the lingula suspicious for pneumonia. 2. Large right-sided small bowel and fat containing inguinal hernia without bowel obstruction. 3. Prostate enlargement with Foley balloon and decompressed bladder. Electronically Signed   By: Tollie Ethavid  Kwon M.D.   On: 06/03/2016 16:04     Urinary retention  Fecal impaction (HCC)  Constipation, unspecified constipation type  Prostate hypertrophy  Acute cystitis with hematuria     Everlene FarrierWilliam Chamya Hunton, PA-C 06/03/16 1844    Mancel BaleElliott Wentz, MD 06/04/16 2219

## 2016-06-03 NOTE — ED Provider Notes (Signed)
[facility]-EMERGENCY DEPT Provider Note   CSN: [id number] Arrival date & time: 06/03/16  1020     History   Chief Complaint Chief Complaint  Patient presents with  . Constipation    HPI Stephen Cole is a 83 y.o. male.  HPI   Pt with hx Parkinson's disease p/w constipation, stool impaction, inability to urinate, lower abdominal pain.  Constipation was gradual in onset, has been drinking smooth move, prune juice, has been given multiple enemas and wife attempted to disimpact without improvement.  Pt last urinated last night and has been unable to urinate today.  Lower abdominal pain began today.  Pt has recently improved after influenza-like symptoms last night.  He has no further fevers, cough/cold symptoms, no CP, no SOB.  Denies N/V.  Past Medical History:  Diagnosis Date  . Closed head injury 1991  . Glaucoma   . Hypothyroid   . Parkinson disease [facility](HCC)     Patient Active Problem List   Diagnosis Date Noted  . History of traumatic brain injury 08/15/2013  . Paralysis agitans (HCC) 01/10/2013  . 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 partial thumb amputation         Home Medications    Prior to Admission medications   Medication Sig Start Date End Date Taking? Authorizing Provider  carbidopa-levodopa (SINEMET IR) 25-100 MG tablet Take 3 tablets by mouth 2 (two) times daily.   Yes Historical Provider, MD  Hydroactive Dressings (DUODERM HYDROACTIVE) GEL Apply 1 application topically daily as needed for wound care. 05/26/16  Yes Historical Provider, MD  latanoprost (XALATAN) 0.005 % ophthalmic solution Place 1 drop into both eyes at bedtime.   Yes Historical Provider, MD  Multiple Vitamin (MULTIVITAMIN WITH MINERALS) TABS tablet Take 1 tablet by mouth daily as needed (for cold symptoms).   Yes Historical Provider, MD  OVER THE COUNTER MEDICATION Take 1 packet by mouth daily as needed (for constipation). *Smooth Move  Tea*   Yes Historical Provider, MD  phenylephrine (SUDAFED PE) 10 MG TABS tablet Take 10 mg by mouth 2 (two) times daily as needed (for cold).   Yes Historical Provider, MD  RYTARY 48.75-195 MG CPCR TAKE 3 TABS IN MORNING, 2 IN AFTERNOON, 2 IN EVENING *DRUG NOT COVERED* 05/01/16  Yes Rebecca S Tat, DO  SYNTHROID 100 MCG tablet Take 100 mcg by mouth daily before breakfast.  01/02/13  Yes Historical Provider, MD    Family History History reviewed. No pertinent family history.  Social History Social History  Substance Use Topics  . Smoking status: Former Games developermoker  . Smokeless tobacco: Former NeurosurgeonUser  . Alcohol use No     Allergies   Patient has no known allergies.   Review of Systems Review of Systems  All other systems reviewed and are negative.    Physical Exam Updated Vital Signs BP 151/83   Pulse 97   Temp 98 F (36.7 C) (Oral)   Resp 18   SpO2 94%   Physical Exam  Constitutional: He appears well-developed and well-nourished. No distress.  HENT:  Head: Normocephalic and atraumatic.  Neck: Neck supple.  Cardiovascular: Normal rate and regular rhythm.   Pulmonary/Chest: Effort normal and breath sounds normal. No respiratory distress. He has no wheezes. He has no rales.  Abdominal: Soft. He exhibits no distension. There is tenderness (suprapubic ). There is no rebound and no guarding.  Suprapubic fullness   Genitourinary:  Genitourinary Comments: +stool impaction.  Stool is brown, hard.    Neurological: He is alert. He exhibits normal muscle tone.  Skin: He is not diaphoretic.  Nursing note and vitals reviewed.    ED Treatments / Results  Labs (all labs ordered are listed, but only abnormal results are displayed) Labs Reviewed  BASIC METABOLIC PANEL - Abnormal; Notable for the following:       Result Value   Sodium 132 (*)    Chloride 95 (*)    Glucose, Bld 117 (*)    BUN 34 (*)    Creatinine, Ser 1.80 (*)    GFR calc non Af Amer 34 (*)    GFR calc Af Amer 39  (*)    All other components within normal limits  CBC WITH DIFFERENTIAL/PLATELET - Abnormal; Notable for the following:    WBC 29.1 (*)    Platelets 417 (*)    Neutro Abs 24.1 (*)    Monocytes Absolute 1.5 (*)    All other components within normal limits  URINALYSIS, ROUTINE W REFLEX MICROSCOPIC - Abnormal; Notable for the following:    APPearance HAZY (*)    Hgb urine dipstick LARGE (*)    Ketones, ur 5 (*)    Protein, ur 30 (*)    Bacteria, UA RARE (*)    Squamous Epithelial / LPF 0-5 (*)    All other components within normal limits  URINE CULTURE    EKG  EKG Interpretation None       Radiology No results found.  Procedures Procedures (including critical care time)  Medications Ordered in ED Medications  cefTRIAXone (ROCEPHIN) 1 g in dextrose 5 % 50 mL IVPB (not administered)  iopamidol (ISOVUE-300) 61 % injection 30 mL (30 mLs Oral Contrast Given 06/03/16 1353)  sodium chloride 0.9 % bolus 500 mL (0 mLs Intravenous Stopped 06/03/16 1506)     Initial Impression / Assessment and Plan / ED Course  I have reviewed the triage vital signs and the nursing notes.  Pertinent labs & imaging results that were available during my care of the patient were reviewed by me and considered in my medical decision making (see chart for details).  Clinical Course as of Jun 03 1530  Wed Jun 03, 2016  1204 Noted pt has 730cc in bladder.  Foley ordered.   [EW]    Clinical Course User Index [EW] Trixie Dredge, PA-C   Afebrile nontoxic patient with hx Parkinson's disease with lower abdominal discomfort, constipation/stool impaction, urinary retention.  I disimpacted patient with large volume of stool removed.  Pt with lower abdominal mass, bladder with >700cc urine  - foley placed.  UA with 6-30 WBC, will treat with rocephin, send culture.  CT scan pending at change of shift.  Dr Effie Shy to also see patient.  Pt signed out to Will Dansie, PA-C, pending CT.     Final Clinical Impressions(s) /  ED Diagnoses   Final diagnoses:  Fecal impaction [facility])  Urinary retention    New Prescriptions New Prescriptions   No medications on file     Trixie Dredge, PA-C 06/03/16 1535    Mancel Bale, MD 06/04/16 2220

## 2016-06-03 NOTE — ED Provider Notes (Signed)
  Face-to-face evaluation   History: Decreased stooling for couple weeks, and constipation unrelieved with juice, laxative tea, and enemas.  Physical exam: Elderly, alert, cooperative. Abdomen soft, nontender to palpation.  Medical screening examination/treatment/procedure(s) were conducted as a shared visit with non-physician practitioner(s) and myself.  I personally evaluated the patient during the encounter   Mancel BaleElliott Janilah Hojnacki, MD 06/04/16 2222

## 2016-06-05 LAB — URINE CULTURE: Culture: NO GROWTH

## 2016-06-11 DIAGNOSIS — M25511 Pain in right shoulder: Secondary | ICD-10-CM | POA: Diagnosis not present

## 2016-06-11 DIAGNOSIS — F329 Major depressive disorder, single episode, unspecified: Secondary | ICD-10-CM | POA: Diagnosis not present

## 2016-06-11 DIAGNOSIS — E038 Other specified hypothyroidism: Secondary | ICD-10-CM | POA: Diagnosis not present

## 2016-06-11 DIAGNOSIS — H4089 Other specified glaucoma: Secondary | ICD-10-CM | POA: Diagnosis not present

## 2016-06-11 DIAGNOSIS — G20 Parkinson's disease: Secondary | ICD-10-CM | POA: Diagnosis not present

## 2016-06-11 DIAGNOSIS — E039 Hypothyroidism, unspecified: Secondary | ICD-10-CM | POA: Diagnosis not present

## 2016-06-11 DIAGNOSIS — I10 Essential (primary) hypertension: Secondary | ICD-10-CM | POA: Diagnosis not present

## 2016-06-11 DIAGNOSIS — Z87440 Personal history of urinary (tract) infections: Secondary | ICD-10-CM | POA: Diagnosis not present

## 2016-06-11 DIAGNOSIS — F0280 Dementia in other diseases classified elsewhere without behavioral disturbance: Secondary | ICD-10-CM | POA: Diagnosis not present

## 2016-06-11 DIAGNOSIS — Z466 Encounter for fitting and adjustment of urinary device: Secondary | ICD-10-CM | POA: Diagnosis not present

## 2016-06-11 DIAGNOSIS — Z9181 History of falling: Secondary | ICD-10-CM | POA: Diagnosis not present

## 2016-06-17 DIAGNOSIS — R531 Weakness: Secondary | ICD-10-CM | POA: Diagnosis not present

## 2016-06-19 DIAGNOSIS — R296 Repeated falls: Secondary | ICD-10-CM | POA: Diagnosis not present

## 2016-06-19 DIAGNOSIS — G20 Parkinson's disease: Secondary | ICD-10-CM | POA: Diagnosis not present

## 2016-06-19 DIAGNOSIS — K5909 Other constipation: Secondary | ICD-10-CM | POA: Diagnosis not present

## 2016-06-19 DIAGNOSIS — M6281 Muscle weakness (generalized): Secondary | ICD-10-CM | POA: Diagnosis not present

## 2016-07-01 DIAGNOSIS — G20 Parkinson's disease: Secondary | ICD-10-CM | POA: Diagnosis not present

## 2016-07-01 DIAGNOSIS — F0390 Unspecified dementia without behavioral disturbance: Secondary | ICD-10-CM | POA: Diagnosis not present

## 2016-07-06 DIAGNOSIS — R338 Other retention of urine: Secondary | ICD-10-CM | POA: Diagnosis not present

## 2016-07-06 DIAGNOSIS — R972 Elevated prostate specific antigen [PSA]: Secondary | ICD-10-CM | POA: Diagnosis not present

## 2016-07-29 DIAGNOSIS — F0390 Unspecified dementia without behavioral disturbance: Secondary | ICD-10-CM | POA: Diagnosis not present

## 2016-07-29 DIAGNOSIS — G20 Parkinson's disease: Secondary | ICD-10-CM | POA: Diagnosis not present

## 2016-08-29 DIAGNOSIS — G20 Parkinson's disease: Secondary | ICD-10-CM | POA: Diagnosis not present

## 2016-08-29 DIAGNOSIS — F0390 Unspecified dementia without behavioral disturbance: Secondary | ICD-10-CM | POA: Diagnosis not present

## 2016-09-06 ENCOUNTER — Encounter (HOSPITAL_BASED_OUTPATIENT_CLINIC_OR_DEPARTMENT_OTHER): Payer: Self-pay | Admitting: Emergency Medicine

## 2016-09-06 ENCOUNTER — Emergency Department (HOSPITAL_BASED_OUTPATIENT_CLINIC_OR_DEPARTMENT_OTHER)
Admission: EM | Admit: 2016-09-06 | Discharge: 2016-09-06 | Disposition: A | Discharge status: Home / Self Care | Payer: Medicare Other | Attending: Emergency Medicine | Admitting: Emergency Medicine

## 2016-09-06 DIAGNOSIS — T83091A Other mechanical complication of indwelling urethral catheter, initial encounter: Secondary | ICD-10-CM | POA: Diagnosis not present

## 2016-09-06 DIAGNOSIS — E039 Hypothyroidism, unspecified: Secondary | ICD-10-CM | POA: Diagnosis not present

## 2016-09-06 DIAGNOSIS — T83038A Leakage of other indwelling urethral catheter, initial encounter: Secondary | ICD-10-CM | POA: Diagnosis not present

## 2016-09-06 DIAGNOSIS — Y829 Unspecified medical devices associated with adverse incidents: Secondary | ICD-10-CM | POA: Diagnosis not present

## 2016-09-06 DIAGNOSIS — Z87891 Personal history of nicotine dependence: Secondary | ICD-10-CM | POA: Insufficient documentation

## 2016-09-06 DIAGNOSIS — Z79899 Other long term (current) drug therapy: Secondary | ICD-10-CM | POA: Insufficient documentation

## 2016-09-06 DIAGNOSIS — G20 Parkinson's disease: Secondary | ICD-10-CM | POA: Diagnosis not present

## 2016-09-06 DIAGNOSIS — R319 Hematuria, unspecified: Secondary | ICD-10-CM | POA: Diagnosis present

## 2016-09-06 DIAGNOSIS — R310 Gross hematuria: Secondary | ICD-10-CM

## 2016-09-06 DIAGNOSIS — T839XXA Unspecified complication of genitourinary prosthetic device, implant and graft, initial encounter: Secondary | ICD-10-CM

## 2016-09-06 MED ORDER — LIDOCAINE HCL 2 % EX GEL
1.0000 "application " | Freq: Once | CUTANEOUS | Status: AC
  Administered 2016-09-06: 1 via URETHRAL
  Filled 2016-09-06: qty 20

## 2016-09-06 NOTE — ED Provider Notes (Signed)
[facility] Provider Note   CSN: [id number] Arrival date & time: 09/06/16  1614  By signing my name below, I, Linna Darner, attest that this documentation has been prepared under the direction and in the presence of Fayrene Helper, PA-C . Electronically Signed: Linna Darner, Scribe. 09/06/2016. 4:46 PM.  History   Chief Complaint Chief Complaint  Patient presents with  . Hematuria    The history is provided by the patient and the spouse. No language interpreter was used.     HPI Comments: Kevontay Burks is a 83 y.o. male brought in by his wife, with PMHx including Parkinson's disease, BPH, and chronic inguinal hernia, who presents to the Emergency Department for evaluation of urinary retention secondary to an issue with his foley catheter beginning yesterday. Per his wife, he has worn a foley catheter since January 2018 because he "shakes" while urinating due to his h/o Parkinson's and is unable to urinate into the toilet bowl. Yesterday, wife tried to replace pt's foley catheter with a 46 [place] and patient began complaining of "leakage" around the catheter site shortly thereafter. Wife subsequently removed the catheter and he is not wearing one currently. He has not had a full urine void since yesterday prior to the attempted catheter replacement. Wife states that patient has had some episodes of hematuria today without any episodes yesterday. He is also reporting some mild urethral pain currently. Patient denies back pain, abdominal pain, or any other associated symptoms  Past Medical History:  Diagnosis Date  . Closed head injury 1991  . Glaucoma   . Hypothyroid   . Parkinson disease [facility])     Patient Active Problem List   Diagnosis Date Noted  . History of traumatic brain injury 08/15/2013  . Paralysis agitans (HCC) 01/10/2013  . 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 partial thumb amputation          Home Medications    Prior to Admission medications   Medication Sig Start Date End Date Taking? Authorizing Provider  carbidopa-levodopa (SINEMET IR) 25-100 MG tablet Take 3 tablets by mouth 2 (two) times daily.   Yes Historical Provider, MD  docusate sodium (COLACE) 100 MG capsule Take 100 mg by mouth daily.   Yes Historical Provider, MD  Multiple Vitamin (MULTIVITAMIN WITH MINERALS) TABS tablet Take 1 tablet by mouth daily as needed (for cold symptoms).   Yes Historical Provider, MD  phenylephrine (SUDAFED PE) 10 MG TABS tablet Take 10 mg by mouth 2 (two) times daily as needed (for cold).   Yes Historical Provider, MD  polyethylene glycol powder (GLYCOLAX/MIRALAX) powder Take 17 g by mouth 2 (two) times daily. 06/03/16  Yes Everlene Farrier, PA-C  SYNTHROID 100 MCG tablet Take 100 mcg by mouth daily before breakfast.  01/02/13  Yes Historical Provider, MD  amoxicillin-clavulanate (AUGMENTIN) 875-125 MG tablet Take 1 tablet by mouth every 12 (twelve) hours. 06/03/16   Everlene Farrier, PA-C  Hydroactive Dressings (DUODERM HYDROACTIVE) GEL Apply 1 application topically daily as needed for wound care. 05/26/16   Historical Provider, MD  latanoprost (XALATAN) 0.005 % ophthalmic solution Place 1 drop into both eyes at bedtime.    Historical Provider, MD  OVER THE COUNTER MEDICATION Take 1 packet by mouth daily as needed (for constipation). *Smooth Move Tea*    Historical Provider, MD  RYTARY 48.75-195 MG CPCR TAKE 3 TABS IN MORNING, 2 IN AFTERNOON, 2 IN EVENING *DRUG NOT COVERED* 05/01/16  Octaviano Batty Tat, DO    Family History No family history on file.  Social History Social History  Substance Use Topics  . Smoking status: Former Games developer  . Smokeless tobacco: Former Neurosurgeon  . Alcohol use No     Allergies   Patient has no known allergies.   Review of Systems Review of Systems  Gastrointestinal: Negative for abdominal pain.  Genitourinary: Positive for difficulty urinating, hematuria  and penile pain.  Musculoskeletal: Negative for back pain.  Neurological: Positive for tremors (baseline due to Parkinson's).   Physical Exam Updated Vital Signs BP (!) 155/81 (BP Location: Left Arm)   Pulse 97   Temp 98.3 F (36.8 C) (Oral)   Resp 20   Ht  (1.702 m)   Wt 156 lb (70.8 kg)   SpO2 96%   BMI 24.43 kg/m   Physical Exam  Constitutional: He is oriented to person, place, and time. He appears well-developed and well-nourished. No distress.  HENT:  Head: Normocephalic and atraumatic.  Eyes: Conjunctivae and EOM are normal.  Neck: Neck supple. No tracheal deviation present.  Cardiovascular: Normal rate.   Pulmonary/Chest: Effort normal. No respiratory distress.  Genitourinary:  Genitourinary Comments: Chronic right inguinal hernia noted that is non-TTP. Uncircumcised penis with small amount of blood noted at urethral meatus. Urethral meatus is non-TTP. Perineum is soft.   Musculoskeletal: Normal range of motion.  Neurological: He is alert and oriented to person, place, and time.  Skin: Skin is warm and dry.  Psychiatric: He has a normal mood and affect. His behavior is normal.  Nursing note and vitals reviewed.  ED Treatments / Results  Labs (all labs ordered are listed, but only abnormal results are displayed) Labs Reviewed  URINALYSIS, ROUTINE W REFLEX MICROSCOPIC    EKG  EKG Interpretation None       Radiology No results found.  Procedures BLADDER CATHETERIZATION Date/Time: 09/06/2016 6:43 PM Performed by: Fayrene Helper Authorized by: Fayrene Helper   Consent:    Consent obtained:  Verbal   Consent given by:  Patient   Risks discussed:  Urethral injury and false passage   Alternatives discussed:  Delayed treatment Pre-procedure details:    Procedure purpose:  Therapeutic   Preparation: Patient was prepped and draped in usual sterile fashion   Anesthesia (see MAR for exact dosages):    Anesthesia method:  Topical application   Topical  anesthetic:  Lidocaine gel Procedure details:    Provider performed due to:  Complicated insertion and nurse unable to complete   Catheter insertion:  Temporary indwelling   Catheter type:  Coude   Catheter size:  22 Fr   Bladder irrigation: yes     Number of attempts:  1   Urine characteristics:  Bloody Post-procedure details:    Patient tolerance of procedure:  Tolerated well, no immediate complications   (including critical care time)    DIAGNOSTIC STUDIES: Oxygen Saturation is 96% on RA, adequate by my interpretation.    COORDINATION OF CARE: 4:58 PM Discussed treatment plan with pt and his wife at bedside and they agreed to plan.  Medications Ordered in ED Medications  lidocaine (XYLOCAINE) 2 % jelly 1 application (1 application Urethral Given by Other 09/06/16 1754)     Initial Impression / Assessment and Plan / ED Course  I have reviewed the triage vital signs and the nursing notes.  Pertinent labs & imaging results that were available during my care of the patient were reviewed by me and considered in  my medical decision making (see chart for details).     BP (!) 155/81 (BP Location: Left Arm)   Pulse 97   Temp 98.3 F (36.8 C) (Oral)   Resp 20   Ht  (1.702 m)   Wt 70.8 kg   SpO2 96%   BMI 24.43 kg/m    Final Clinical Impressions(s) / ED Diagnoses   Final diagnoses:  Gross hematuria  Foley catheter problem, initial encounter [facility])    New Prescriptions New Prescriptions   No medications on file   I personally performed the services described in this documentation, which was scribed in my presence. The recorded information has been reviewed and is accurate.   Pt has a temporary foley in due to advance Parkinson and having trouble urinating without "urinate on self".  Wife changed foley today but it started to bleed and clotted.  I was able to advance a coude catheter and establish urine flow.  Pt tolerates well.  Pt can f/u with urologist for  further care.  Care discussed with Dr. Jacqulyn Bath.    Fayrene Helper, PA-C 09/06/16 1908    Maia Plan, MD 09/07/16 1901

## 2016-09-06 NOTE — ED Notes (Signed)
2 attempts to insert foley catheter by this RN and assistance from ED tech. First attempt with 13F regular foley and 2nd attempt with 70F coude catheter. Unable to pass catheter pass prostate. PA Bowie notified.

## 2016-09-06 NOTE — ED Notes (Signed)
Pt has blood dripping from penis . Wife states she removed a catheter today and was unable to reinsert a new one. Wife states she removed catheter due to no urine coming out and she states she saw blood clots and bleeding when she removed catheter at home.

## 2016-09-06 NOTE — Discharge Instructions (Signed)
Please flush foley at home if it becomes clogged and follow up with urologist for further care. Return to the ER if you have any concerns.

## 2016-09-06 NOTE — ED Triage Notes (Signed)
Hematuria today, wife does cath. At home with blood clots and removed cath, due to blood clots. Wife ran out of cath's today.

## 2016-09-28 DIAGNOSIS — G20 Parkinson's disease: Secondary | ICD-10-CM | POA: Diagnosis not present

## 2016-09-28 DIAGNOSIS — F0390 Unspecified dementia without behavioral disturbance: Secondary | ICD-10-CM | POA: Diagnosis not present

## 2016-10-29 DIAGNOSIS — F0390 Unspecified dementia without behavioral disturbance: Secondary | ICD-10-CM | POA: Diagnosis not present

## 2016-10-29 DIAGNOSIS — G20 Parkinson's disease: Secondary | ICD-10-CM | POA: Diagnosis not present

## 2016-11-28 DIAGNOSIS — F0390 Unspecified dementia without behavioral disturbance: Secondary | ICD-10-CM | POA: Diagnosis not present

## 2016-11-28 DIAGNOSIS — G20 Parkinson's disease: Secondary | ICD-10-CM | POA: Diagnosis not present

## 2016-12-15 ENCOUNTER — Inpatient Hospital Stay (HOSPITAL_COMMUNITY)
Admission: EM | Admit: 2016-12-15 | Discharge: 2016-12-19 | DRG: 872 | Disposition: A | Discharge status: Skilled Nursing Facility | Payer: Medicare Other | Attending: Internal Medicine | Admitting: Internal Medicine

## 2016-12-15 ENCOUNTER — Emergency Department (HOSPITAL_COMMUNITY): Payer: Medicare Other

## 2016-12-15 DIAGNOSIS — Z87891 Personal history of nicotine dependence: Secondary | ICD-10-CM

## 2016-12-15 DIAGNOSIS — G20 Parkinson's disease: Secondary | ICD-10-CM | POA: Diagnosis not present

## 2016-12-15 DIAGNOSIS — A4152 Sepsis due to Pseudomonas: Secondary | ICD-10-CM | POA: Diagnosis not present

## 2016-12-15 DIAGNOSIS — Z8249 Family history of ischemic heart disease and other diseases of the circulatory system: Secondary | ICD-10-CM | POA: Diagnosis not present

## 2016-12-15 DIAGNOSIS — R7989 Other specified abnormal findings of blood chemistry: Secondary | ICD-10-CM | POA: Diagnosis not present

## 2016-12-15 DIAGNOSIS — Z89019 Acquired absence of unspecified thumb: Secondary | ICD-10-CM | POA: Diagnosis not present

## 2016-12-15 DIAGNOSIS — R1319 Other dysphagia: Secondary | ICD-10-CM | POA: Diagnosis not present

## 2016-12-15 DIAGNOSIS — E860 Dehydration: Secondary | ICD-10-CM | POA: Diagnosis present

## 2016-12-15 DIAGNOSIS — R2689 Other abnormalities of gait and mobility: Secondary | ICD-10-CM | POA: Diagnosis not present

## 2016-12-15 DIAGNOSIS — E039 Hypothyroidism, unspecified: Secondary | ICD-10-CM | POA: Diagnosis not present

## 2016-12-15 DIAGNOSIS — R34 Anuria and oliguria: Secondary | ICD-10-CM | POA: Diagnosis present

## 2016-12-15 DIAGNOSIS — B965 Pseudomonas (aeruginosa) (mallei) (pseudomallei) as the cause of diseases classified elsewhere: Secondary | ICD-10-CM | POA: Diagnosis not present

## 2016-12-15 DIAGNOSIS — R296 Repeated falls: Secondary | ICD-10-CM | POA: Diagnosis not present

## 2016-12-15 DIAGNOSIS — R6520 Severe sepsis without septic shock: Secondary | ICD-10-CM | POA: Diagnosis not present

## 2016-12-15 DIAGNOSIS — Z66 Do not resuscitate: Secondary | ICD-10-CM | POA: Diagnosis not present

## 2016-12-15 DIAGNOSIS — E875 Hyperkalemia: Secondary | ICD-10-CM | POA: Diagnosis not present

## 2016-12-15 DIAGNOSIS — N183 Chronic kidney disease, stage 3 (moderate): Secondary | ICD-10-CM | POA: Diagnosis not present

## 2016-12-15 DIAGNOSIS — E038 Other specified hypothyroidism: Secondary | ICD-10-CM | POA: Diagnosis not present

## 2016-12-15 DIAGNOSIS — R509 Fever, unspecified: Secondary | ICD-10-CM

## 2016-12-15 DIAGNOSIS — K5909 Other constipation: Secondary | ICD-10-CM | POA: Diagnosis not present

## 2016-12-15 DIAGNOSIS — Z87820 Personal history of traumatic brain injury: Secondary | ICD-10-CM | POA: Diagnosis not present

## 2016-12-15 DIAGNOSIS — Z79899 Other long term (current) drug therapy: Secondary | ICD-10-CM

## 2016-12-15 DIAGNOSIS — H409 Unspecified glaucoma: Secondary | ICD-10-CM | POA: Diagnosis not present

## 2016-12-15 DIAGNOSIS — N179 Acute kidney failure, unspecified: Secondary | ICD-10-CM | POA: Diagnosis not present

## 2016-12-15 DIAGNOSIS — R4182 Altered mental status, unspecified: Secondary | ICD-10-CM | POA: Diagnosis not present

## 2016-12-15 DIAGNOSIS — A419 Sepsis, unspecified organism: Secondary | ICD-10-CM | POA: Diagnosis present

## 2016-12-15 DIAGNOSIS — R488 Other symbolic dysfunctions: Secondary | ICD-10-CM | POA: Diagnosis not present

## 2016-12-15 DIAGNOSIS — N390 Urinary tract infection, site not specified: Secondary | ICD-10-CM | POA: Diagnosis present

## 2016-12-15 DIAGNOSIS — M6281 Muscle weakness (generalized): Secondary | ICD-10-CM | POA: Diagnosis not present

## 2016-12-15 DIAGNOSIS — R278 Other lack of coordination: Secondary | ICD-10-CM | POA: Diagnosis not present

## 2016-12-15 LAB — COMPREHENSIVE METABOLIC PANEL
ALBUMIN: 3.2 g/dL — AB (ref 3.5–5.0)
ALT: 5 U/L — AB (ref 17–63)
AST: 21 U/L (ref 15–41)
Alkaline Phosphatase: 79 U/L (ref 38–126)
Anion gap: 11 (ref 5–15)
BUN: 42 mg/dL — AB (ref 6–20)
CHLORIDE: 101 mmol/L (ref 101–111)
CO2: 23 mmol/L (ref 22–32)
CREATININE: 1.99 mg/dL — AB (ref 0.61–1.24)
Calcium: 8.4 mg/dL — ABNORMAL LOW (ref 8.9–10.3)
GFR calc Af Amer: 34 mL/min — ABNORMAL LOW (ref >60)
GFR, EST NON AFRICAN AMERICAN: 30 mL/min — AB (ref >60)
GLUCOSE: 109 mg/dL — AB (ref 65–99)
POTASSIUM: 5.2 mmol/L — AB (ref 3.5–5.1)
SODIUM: 135 mmol/L (ref 135–145)
Total Bilirubin: 0.5 mg/dL (ref 0.3–1.2)
Total Protein: 6.2 g/dL — ABNORMAL LOW (ref 6.5–8.1)

## 2016-12-15 LAB — CBG MONITORING, ED: Glucose-Capillary: 111 mg/dL — ABNORMAL HIGH (ref 65–99)

## 2016-12-15 LAB — CBC WITH DIFFERENTIAL/PLATELET
BASOS ABS: 0 10*3/uL (ref 0.0–0.1)
BASOS PCT: 0 %
EOS PCT: 0 %
Eosinophils Absolute: 0 10*3/uL (ref 0.0–0.7)
HEMATOCRIT: 44.8 % (ref 39.0–52.0)
HEMOGLOBIN: 15.2 g/dL (ref 13.0–17.0)
LYMPHS ABS: 2.7 10*3/uL (ref 0.7–4.0)
LYMPHS PCT: 12 %
MCH: 30.6 pg (ref 26.0–34.0)
MCHC: 33.9 g/dL (ref 30.0–36.0)
MCV: 90.3 fL (ref 78.0–100.0)
MONOS PCT: 8 %
Monocytes Absolute: 1.8 10*3/uL — ABNORMAL HIGH (ref 0.1–1.0)
NEUTROS ABS: 17.6 10*3/uL — AB (ref 1.7–7.7)
Neutrophils Relative %: 80 %
Platelets: 544 10*3/uL — ABNORMAL HIGH (ref 150–400)
RBC: 4.96 MIL/uL (ref 4.22–5.81)
RDW: 13.4 % (ref 11.5–15.5)
WBC: 22.1 10*3/uL — ABNORMAL HIGH (ref 4.0–10.5)

## 2016-12-15 LAB — I-STAT CG4 LACTIC ACID, ED: Lactic Acid, Venous: 1.05 mmol/L (ref 0.5–1.9)

## 2016-12-15 LAB — PROTIME-INR
INR: 0.99
Prothrombin Time: 13.1 seconds (ref 11.4–15.2)

## 2016-12-15 MED ORDER — SODIUM CHLORIDE 0.9 % IV BOLUS (SEPSIS)
1000.0000 mL | Freq: Once | INTRAVENOUS | Status: AC
  Administered 2016-12-16: 1000 mL via INTRAVENOUS

## 2016-12-15 MED ORDER — PIPERACILLIN-TAZOBACTAM 3.375 G IVPB
3.3750 g | Freq: Three times a day (TID) | INTRAVENOUS | Status: DC
  Administered 2016-12-16: 3.375 g via INTRAVENOUS
  Filled 2016-12-15 (×2): qty 50

## 2016-12-15 MED ORDER — VANCOMYCIN HCL IN DEXTROSE 750-5 MG/150ML-% IV SOLN: 750.0000 mg | INTRAVENOUS | Status: DC

## 2016-12-15 MED ORDER — VANCOMYCIN HCL IN DEXTROSE 1-5 GM/200ML-% IV SOLN
1000.0000 mg | Freq: Once | INTRAVENOUS | Status: AC
  Administered 2016-12-16: 1000 mg via INTRAVENOUS
  Filled 2016-12-15: qty 200

## 2016-12-15 MED ORDER — PIPERACILLIN-TAZOBACTAM 3.375 G IVPB 30 MIN
3.3750 g | Freq: Once | INTRAVENOUS | Status: AC
  Administered 2016-12-16: 3.375 g via INTRAVENOUS
  Filled 2016-12-15: qty 50

## 2016-12-15 MED ORDER — ACETAMINOPHEN 325 MG PO TABS
650.0000 mg | ORAL_TABLET | Freq: Once | ORAL | Status: AC | PRN
  Administered 2016-12-15: 650 mg via ORAL
  Filled 2016-12-15: qty 2

## 2016-12-15 NOTE — Progress Notes (Signed)
Pharmacy Antibiotic Note  Stephen Cole is a 83 y.o. male admitted on 12/15/2016 with sepsis.  Pharmacy has been consulted for vancomycin and Zosyn dosing.  Plan:  Vancomycin 1000 mg IV now, then 750 mg IV q24 hr; goal trough 15-20 mcg/mL  Measure vancomycin trough levels at steady state as indicated  Zosyn 3.375 g IV given once over 30 minutes, then every 8 hrs by 4-hr infusion  Daily SCr d/t high risk for AKI  Weight: 157 lb (71.2 kg)  Temp (24hrs), Avg:99.9 F (37.7 C), Min:99.9 F (37.7 C), Max:99.9 F (37.7 C)   Recent Labs Lab 12/15/16 2125 12/15/16 2137  WBC 22.1*  --   CREATININE 1.99*  --   LATICACIDVEN  --  1.05    Estimated Creatinine Clearance: 26.8 mL/min (A) (by C-G formula based on SCr of 1.99 mg/dL (H)).    No Known Allergies   Thank you for allowing pharmacy to be a part of this patient's care.  Bernadene Personrew Andreina Outten, PharmD, BCPS Pager: [phone] 12/15/2016, 11:11 PM

## 2016-12-15 NOTE — ED Triage Notes (Signed)
Pt had onset of fever this AM last temp at home 100.0 per wife. Pt has had decreased po fluid intake and decrease urine out put per wife who is a Engineer, civil (consulting)nurse. 18 ga started left hand and 1 liter 0.9nss infusing VS; BP=155/103 P= 102 R= 22 NSR with frequent PVC on monitor CBG= 135 Sat 92%RA that increased 97% on 2L n/c

## 2016-12-15 NOTE — ED Notes (Signed)
Bed: WA09 Expected date:  Expected time:  Means of arrival:  Comments: EMS 83 yo male with fever/altered mental status/decreased urine output O2 sat 92%

## 2016-12-15 NOTE — ED Notes (Signed)
Patient transported to X-ray 

## 2016-12-15 NOTE — ED Provider Notes (Signed)
[facility]-EMERGENCY DEPT Provider Note   CSN: [id number] Arrival date & time: 12/15/16  2058     History   Chief Complaint Chief Complaint  Patient presents with  . Fever    HPI Stephen Cole is a 83 y.o. male brought in by wife and step son for fever and progressive worsening parkinson's. Over the last 2-3 days the patient has had decreased po intake, espically with fluids. This morning patient began running a fever, with Tmax of 100.0 at home taken orally. Wife also noted decreased urine output and stated he has only produced 20ml of urine over the last 24 hours even after in and out done at home (pt wife is a Engineer, civil (consulting)nurse). The patient does note left sided flank pain. Denies chills, myalgias, arthralgias, DOE, SOB, cough, chest tightness or pressure, diaphoresis, abdominal pain, nausea, vomiting, diarrhea, constipation. Last BM this morning and normal.   The family also notes difficulty at home. Patient lives at home with wife who is still a full time nurse. The patient has had increased difficulty with gait since the beginning of the year and had 2 falls in the last 2 week in which EMS has had to be called to help get the patient up because the patients wife is unable to get him up on her on. Patient also receives assistance from step-son and neighbor but they are not always available.   HPI  Past Medical History:  Diagnosis Date  . Closed head injury 1991  . Glaucoma   . Hypothyroid   . Parkinson disease [facility](HCC)     Patient Active Problem List   Diagnosis Date Noted  . History of traumatic brain injury 08/15/2013  . Paralysis agitans (HCC) 01/10/2013  . 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 partial thumb amputation         Home Medications    Prior to Admission medications   Medication Sig Start Date End Date Taking? Authorizing Provider  carbidopa-levodopa (SINEMET IR) 25-100 MG tablet Take 3 tablets by mouth 2 (two)  times daily.   Yes [provider]  docusate sodium (COLACE) 100 MG capsule Take 100 mg by mouth daily.   Yes [provider]  Multiple Vitamin (MULTIVITAMIN WITH MINERALS) TABS tablet Take 1 tablet by mouth daily as needed (for cold symptoms).   Yes [provider]  OVER THE COUNTER MEDICATION Take 1 packet by mouth daily as needed (for constipation). *Smooth Move Tea*   Yes [provider]  polyethylene glycol powder (GLYCOLAX/MIRALAX) powder Take 17 g by mouth 2 (two) times daily. 06/03/16  Yes Everlene Farrieransie, [name], PA-C  SYNTHROID 100 MCG tablet Take 100 mcg by mouth daily before breakfast.  01/02/13  Yes [provider]  amoxicillin-clavulanate (AUGMENTIN) 875-125 MG tablet Take 1 tablet by mouth every 12 (twelve) hours. Patient not taking: Reported on 12/15/2016 06/03/16   Everlene Farrieransie, [name], PA-C  Hydroactive Dressings (DUODERM HYDROACTIVE) GEL Apply 1 application topically daily as needed for wound care. 05/26/16   [provider]  phenylephrine (SUDAFED PE) 10 MG TABS tablet Take 10 mg by mouth 2 (two) times daily as needed (for cold).    [provider]  RYTARY [phone] MG CPCR TAKE 3 TABS IN MORNING, 2 IN AFTERNOON, 2 IN EVENING *DRUG NOT COVERED* Patient not taking: Reported on 12/15/2016 05/01/16   Tat, Octaviano Battyebecca S, DO    Family History No family history on file.  Social History Social History  Substance Use Topics  . Smoking status: Former Games developer  . Smokeless tobacco: Former Neurosurgeon  . Alcohol use No     Allergies   Patient has no known allergies.   Review of Systems Review of Systems  All other systems reviewed and are negative.    Physical Exam Updated Vital Signs BP (!) 138/96 (BP Location: Left Arm)   Pulse (!) 112   Temp 99.9 F (37.7 C) (Rectal)   Resp 18   Wt 71.2 kg (157 lb)   SpO2 100%   BMI 24.59 kg/m   Physical Exam  Constitutional: He appears well-developed and well-nourished.  HENT:  Head:  Normocephalic and atraumatic.  Right Ear: External ear normal.  Left Ear: External ear normal.  Nose: Nose normal.  Mouth/Throat: Uvula is midline and oropharynx is clear and moist. Mucous membranes are dry.  Eyes: Pupils are equal, round, and reactive to light. Lids are normal. Right eye exhibits no discharge. Left eye exhibits no discharge. No scleral icterus.  Neck: Trachea normal and full passive range of motion without pain. Neck supple. No JVD present. Normal range of motion present.  Cardiovascular: Regular rhythm and intact distal pulses.  Tachycardia present.   No murmur heard. Pulses:      Radial pulses are 2+ on the right side, and 2+ on the left side.       Dorsalis pedis pulses are 2+ on the right side, and 2+ on the left side.       Posterior tibial pulses are 2+ on the right side, and 2+ on the left side.  No lower extremity swelling or edema. Calves symmetric in size bilaterally.  Pulmonary/Chest: Effort normal and breath sounds normal.  Abdominal: Soft. Bowel sounds are normal. There is no tenderness. There is CVA tenderness (left). There is no rebound, no guarding, no tenderness at McBurney's point and negative Murphy's sign.  Musculoskeletal: He exhibits no edema.  Passive rom of bilateral shoulders, elbows, wrist, knees and ankles without pain. No point tenderness to bilateral hips. Negative log roll test. No sacral crepitus.   Lymphadenopathy:    He has no cervical adenopathy.  Neurological: He is alert.  Skin: Skin is warm, dry and intact. Capillary refill takes less than 2 seconds. No rash noted. He is not diaphoretic. No erythema.  No ulceration noted.   Psychiatric: He has a normal mood and affect.  Nursing note and vitals reviewed.    ED Treatments / Results  Labs (all labs ordered are listed, but only abnormal results are displayed) Labs Reviewed  CBC WITH DIFFERENTIAL/PLATELET - Abnormal; Notable for the following:       Result Value   WBC 22.1 (*)     Platelets 544 (*)    All other components within normal limits  CBG MONITORING, ED - Abnormal; Notable for the following:    Glucose-Capillary 111 (*)    All other components within normal limits  CULTURE, BLOOD (ROUTINE X 2)  CULTURE, BLOOD (ROUTINE X 2)  PROTIME-INR  COMPREHENSIVE METABOLIC PANEL  URINALYSIS, ROUTINE W REFLEX MICROSCOPIC  I-STAT CG4 LACTIC ACID, ED    EKG  EKG Interpretation None       Radiology Dg Chest 2 View  Result Date: 12/15/2016 CLINICAL DATA:  83 year old male with fever, decreased fluid intake and decreased urine output. EXAM: CHEST  2 VIEW COMPARISON:  CT Abdomen and Pelvis 06/03/2016 FINDINGS: Semi upright AP and lateral views of the chest. Somewhat low lung volumes. No pneumothorax or pneumoperitoneum. Normal cardiac  size and mediastinal contours. No pulmonary edema, pleural effusion or confluent pulmonary opacity. Flowing osteophytes or syndesmophytes in the thoracic spine. Chronic left clavicle deformity. No acute osseous abnormality identified. Negative visible bowel gas pattern. Chronic 2 cm gallstone in the right upper quadrant. IMPRESSION: 1.  No acute cardiopulmonary abnormality. 2. Chronic cholelithiasis. Electronically Signed   By: Odessa FlemingH  Hall M.D.   On: 12/15/2016 21:59    Procedures Procedures (including critical care time)  Medications Ordered in ED Medications  acetaminophen (TYLENOL) tablet 650 mg (650 mg Oral Given 12/15/16 2154)     Initial Impression / Assessment and Plan / ED Course  I have reviewed the triage vital signs and the nursing notes.  Pertinent labs & imaging results that were available during my care of the patient were reviewed by me and considered in my medical decision making (see chart for details).     83 year old male with fever, decreased po intake and oliguria. On presentation patient tachycardic, tachypnic, with low grade fever (99.9). WBC elevated at 22.1 with shift. Suspect sepsis. Blood cultures obtained and  will give patient fluid and antibiotics. Lactic acid 1.05. No AKI on CMP. CXR without acute cardiopulmonary abnormality.   I&O ordered for UA, which is suspected source of infection at this time. Case signed out to Ivar Drapeob Browning, PA-C with plan to admit.   Patient case seen and discussed with Dr. Clayborne DanaMesner who is in agreement with plan.   Final Clinical Impressions(s) / ED Diagnoses   Final diagnoses:  None    New Prescriptions New Prescriptions   No medications on file     Princella PellegriniMaczis, Treydon Henricks M, PA-C 12/15/16 [address]2319    Revanth Neidig M, PA-C 12/15/16 2320    Mesner, Barbara CowerJason, MD 12/16/16 [id number]    Marily MemosMesner, Jason, MD 12/18/16 [phone]

## 2016-12-16 ENCOUNTER — Encounter (HOSPITAL_COMMUNITY): Payer: Self-pay | Admitting: Internal Medicine

## 2016-12-16 DIAGNOSIS — Z89019 Acquired absence of unspecified thumb: Secondary | ICD-10-CM | POA: Diagnosis not present

## 2016-12-16 DIAGNOSIS — N179 Acute kidney failure, unspecified: Secondary | ICD-10-CM | POA: Diagnosis present

## 2016-12-16 DIAGNOSIS — Z87891 Personal history of nicotine dependence: Secondary | ICD-10-CM | POA: Diagnosis not present

## 2016-12-16 DIAGNOSIS — R6520 Severe sepsis without septic shock: Secondary | ICD-10-CM | POA: Diagnosis not present

## 2016-12-16 DIAGNOSIS — E038 Other specified hypothyroidism: Secondary | ICD-10-CM | POA: Diagnosis not present

## 2016-12-16 DIAGNOSIS — N183 Chronic kidney disease, stage 3 (moderate): Secondary | ICD-10-CM | POA: Diagnosis present

## 2016-12-16 DIAGNOSIS — H409 Unspecified glaucoma: Secondary | ICD-10-CM | POA: Diagnosis present

## 2016-12-16 DIAGNOSIS — E860 Dehydration: Secondary | ICD-10-CM | POA: Diagnosis present

## 2016-12-16 DIAGNOSIS — R7989 Other specified abnormal findings of blood chemistry: Secondary | ICD-10-CM | POA: Diagnosis present

## 2016-12-16 DIAGNOSIS — B965 Pseudomonas (aeruginosa) (mallei) (pseudomallei) as the cause of diseases classified elsewhere: Secondary | ICD-10-CM | POA: Diagnosis present

## 2016-12-16 DIAGNOSIS — E039 Hypothyroidism, unspecified: Secondary | ICD-10-CM | POA: Diagnosis present

## 2016-12-16 DIAGNOSIS — A419 Sepsis, unspecified organism: Secondary | ICD-10-CM | POA: Diagnosis not present

## 2016-12-16 DIAGNOSIS — R4182 Altered mental status, unspecified: Secondary | ICD-10-CM | POA: Diagnosis not present

## 2016-12-16 DIAGNOSIS — G20 Parkinson's disease: Secondary | ICD-10-CM | POA: Diagnosis present

## 2016-12-16 DIAGNOSIS — R34 Anuria and oliguria: Secondary | ICD-10-CM | POA: Diagnosis present

## 2016-12-16 DIAGNOSIS — Z79899 Other long term (current) drug therapy: Secondary | ICD-10-CM | POA: Diagnosis not present

## 2016-12-16 DIAGNOSIS — E875 Hyperkalemia: Secondary | ICD-10-CM | POA: Diagnosis present

## 2016-12-16 DIAGNOSIS — Z8249 Family history of ischemic heart disease and other diseases of the circulatory system: Secondary | ICD-10-CM | POA: Diagnosis not present

## 2016-12-16 DIAGNOSIS — Z87820 Personal history of traumatic brain injury: Secondary | ICD-10-CM | POA: Diagnosis not present

## 2016-12-16 DIAGNOSIS — K5909 Other constipation: Secondary | ICD-10-CM | POA: Diagnosis present

## 2016-12-16 DIAGNOSIS — N390 Urinary tract infection, site not specified: Secondary | ICD-10-CM

## 2016-12-16 DIAGNOSIS — R509 Fever, unspecified: Secondary | ICD-10-CM | POA: Diagnosis present

## 2016-12-16 DIAGNOSIS — Z66 Do not resuscitate: Secondary | ICD-10-CM | POA: Diagnosis present

## 2016-12-16 DIAGNOSIS — R296 Repeated falls: Secondary | ICD-10-CM | POA: Diagnosis present

## 2016-12-16 LAB — CBC WITH DIFFERENTIAL/PLATELET
BASOS PCT: 0 %
Basophils Absolute: 0 10*3/uL (ref 0.0–0.1)
EOS ABS: 0 10*3/uL (ref 0.0–0.7)
Eosinophils Relative: 0 %
HCT: 40 % (ref 39.0–52.0)
Hemoglobin: 13.5 g/dL (ref 13.0–17.0)
Lymphocytes Relative: 14 %
Lymphs Abs: 2.9 10*3/uL (ref 0.7–4.0)
MCH: 29.9 pg (ref 26.0–34.0)
MCHC: 33.8 g/dL (ref 30.0–36.0)
MCV: 88.7 fL (ref 78.0–100.0)
MONO ABS: 1.2 10*3/uL — AB (ref 0.1–1.0)
Monocytes Relative: 6 %
NEUTROS PCT: 80 %
Neutro Abs: 16.5 10*3/uL — ABNORMAL HIGH (ref 1.7–7.7)
PLATELETS: 516 10*3/uL — AB (ref 150–400)
RBC: 4.51 MIL/uL (ref 4.22–5.81)
RDW: 13.4 % (ref 11.5–15.5)
WBC: 20.6 10*3/uL — AB (ref 4.0–10.5)

## 2016-12-16 LAB — URINALYSIS, ROUTINE W REFLEX MICROSCOPIC
BILIRUBIN URINE: NEGATIVE
Bacteria, UA: NONE SEEN
GLUCOSE, UA: NEGATIVE mg/dL
Ketones, ur: NEGATIVE mg/dL
NITRITE: NEGATIVE
PH: 6 (ref 5.0–8.0)
Protein, ur: 100 mg/dL — AB
SPECIFIC GRAVITY, URINE: 1.01 (ref 1.005–1.030)

## 2016-12-16 LAB — BASIC METABOLIC PANEL
Anion gap: 10 (ref 5–15)
BUN: 41 mg/dL — AB (ref 6–20)
CALCIUM: 7.9 mg/dL — AB (ref 8.9–10.3)
CO2: 20 mmol/L — ABNORMAL LOW (ref 22–32)
CREATININE: 2.13 mg/dL — AB (ref 0.61–1.24)
Chloride: 105 mmol/L (ref 101–111)
GFR calc Af Amer: 32 mL/min — ABNORMAL LOW (ref >60)
GFR, EST NON AFRICAN AMERICAN: 27 mL/min — AB (ref >60)
Glucose, Bld: 109 mg/dL — ABNORMAL HIGH (ref 65–99)
POTASSIUM: 4.5 mmol/L (ref 3.5–5.1)
SODIUM: 135 mmol/L (ref 135–145)

## 2016-12-16 LAB — I-STAT CG4 LACTIC ACID, ED: Lactic Acid, Venous: 0.63 mmol/L (ref 0.5–1.9)

## 2016-12-16 MED ORDER — LATANOPROST 0.005 % OP SOLN
1.0000 [drp] | Freq: Every day | OPHTHALMIC | Status: DC
  Administered 2016-12-16 – 2016-12-18 (×3): 1 [drp] via OPHTHALMIC
  Filled 2016-12-16: qty 2.5

## 2016-12-16 MED ORDER — CARBIDOPA-LEVODOPA 25-100 MG PO TABS
3.0000 | ORAL_TABLET | Freq: Two times a day (BID) | ORAL | Status: DC
  Administered 2016-12-16 – 2016-12-19 (×6): 3 via ORAL
  Filled 2016-12-16 (×7): qty 3

## 2016-12-16 MED ORDER — POLYETHYLENE GLYCOL 3350 17 G PO PACK
17.0000 g | PACK | Freq: Two times a day (BID) | ORAL | Status: DC
  Administered 2016-12-16 – 2016-12-19 (×6): 17 g via ORAL
  Filled 2016-12-16 (×6): qty 1

## 2016-12-16 MED ORDER — HEPARIN SODIUM (PORCINE) 5000 UNIT/ML IJ SOLN
5000.0000 [IU] | Freq: Three times a day (TID) | INTRAMUSCULAR | Status: DC
  Administered 2016-12-16 – 2016-12-19 (×8): 5000 [IU] via SUBCUTANEOUS
  Filled 2016-12-16 (×7): qty 1

## 2016-12-16 MED ORDER — LEVOTHYROXINE SODIUM 100 MCG PO TABS
100.0000 ug | ORAL_TABLET | Freq: Every day | ORAL | Status: DC
  Administered 2016-12-16 – 2016-12-19 (×4): 100 ug via ORAL
  Filled 2016-12-16 (×4): qty 1

## 2016-12-16 MED ORDER — POLYETHYLENE GLYCOL 3350 17 GM/SCOOP PO POWD: 17.0000 g | Freq: Two times a day (BID) | ORAL | Status: DC

## 2016-12-16 MED ORDER — ENSURE ENLIVE PO LIQD
237.0000 mL | Freq: Two times a day (BID) | ORAL | Status: DC
  Administered 2016-12-16 – 2016-12-18 (×5): 237 mL via ORAL

## 2016-12-16 MED ORDER — DEXTROSE 5 % IV SOLN
2.0000 g | INTRAVENOUS | Status: DC
  Administered 2016-12-16 – 2016-12-17 (×2): 2 g via INTRAVENOUS
  Filled 2016-12-16 (×3): qty 2

## 2016-12-16 MED ORDER — SODIUM CHLORIDE 0.9 % IV SOLN
INTRAVENOUS | Status: AC
  Administered 2016-12-16: 04:00:00 via INTRAVENOUS

## 2016-12-16 MED ORDER — DOCUSATE SODIUM 100 MG PO CAPS
100.0000 mg | ORAL_CAPSULE | Freq: Every day | ORAL | Status: DC
  Administered 2016-12-16 – 2016-12-19 (×4): 100 mg via ORAL
  Filled 2016-12-16 (×4): qty 1

## 2016-12-16 NOTE — H&P (Signed)
History and Physical    Doniel Maiello ZOX:[id number] DOB: 08/16/1932 DOA: 12/15/2016  PCP: Martha Clan, MD   Patient coming from: Home.  I have personally briefly reviewed patient's old medical records in [facility]  Chief Complaint: Fever.  HPI: Stephen Cole is a 83 y.o. male with medical history significant of closed head injury, glaucoma, hypothyroidism, Parkinson's disease, chronic constipation who was brought to the emergency department via EMS due to fever since this morning, decreased oral intake and urine output. The patient does not provide much history at this time. His wife however, states that he has been having progressively worse weakness over the past 6 months, with multiple falls, sometimes requiring assistance from EMS to lift him off the floor. She mentions that his mood has been depressed and his Parkinson's has been a lot worse since earlier this year. He has been unable to get out of bed without significant assistance and has been soiling his diapers while in bed. The patient only answers simple questions and does not elaborate on his symptoms. Answering just a note questions, he denies headache, sore throat, productive cough, chest pain, dyspnea, abdominal pain, nausea, emesis, diarrhea, dysuria, frequency or hematuria.   ED Course: Initial vital signs in triage were BP=155/103 P= 102 R= 22 NSR with frequent PVC on monitor CBG= 135 Sat 92%RA that increased 97% on 2L n/c. He received a 1000 mL, vancomycin and Zosyn in the emergency department. His WBC was 22.1, hemoglobin 15.2 g/dL and platelets 811. Sodium 135, potassium 5.2, chloride 101 and bicarbonate 23 mmol/L. BUN was 42, creatinine 1.99 and glucose 109 mg/dL. His lactic acid was normal. Albumin level was 3.2 g/dL.   Review of Systems: As per HPI otherwise 10 point review of systems negative.    Past Medical History:  Diagnosis Date  . Closed head injury 1991  . Glaucoma   . Hypothyroid   . Parkinson  disease [facility])     Past Surgical History:  Procedure Laterality Date  . partial thumb amputation       reports that he has quit smoking. He has quit using smokeless tobacco. He reports that he does not drink alcohol or use drugs.  No Known Allergies  Family History  Problem Relation Age of Onset  . Congestive Heart Failure Mother   . Breast cancer Sister     Prior to Admission medications   Medication Sig Start Date End Date Taking? Authorizing Provider  carbidopa-levodopa (SINEMET IR) 25-100 MG tablet Take 3 tablets by mouth 2 (two) times daily.   Yes [provider]  docusate sodium (COLACE) 100 MG capsule Take 100 mg by mouth daily.   Yes [provider]  Multiple Vitamin (MULTIVITAMIN WITH MINERALS) TABS tablet Take 1 tablet by mouth daily as needed (for cold symptoms).   Yes [provider]  OVER THE COUNTER MEDICATION Take 1 packet by mouth daily as needed (for constipation). *Smooth Move Tea*   Yes [provider]  polyethylene glycol powder (GLYCOLAX/MIRALAX) powder Take 17 g by mouth 2 (two) times daily. 06/03/16  Yes Everlene Farrier, PA-C  SYNTHROID 100 MCG tablet Take 100 mcg by mouth daily before breakfast.  01/02/13  Yes [provider]  amoxicillin-clavulanate (AUGMENTIN) 875-125 MG tablet Take 1 tablet by mouth every 12 (twelve) hours. Patient not taking: Reported on 12/15/2016 06/03/16   Everlene Farrier, PA-C  Hydroactive Dressings (DUODERM HYDROACTIVE) GEL Apply 1 application topically daily as needed for wound care. 05/26/16   [provider]  phenylephrine (SUDAFED PE) 10 MG TABS tablet Take 10 mg by mouth 2 (two) times daily as needed (for cold).    [provider]  RYTARY [phone] MG CPCR TAKE 3 TABS IN MORNING, 2 IN AFTERNOON, 2 IN EVENING *DRUG NOT COVERED* Patient not taking: Reported on 12/15/2016 05/01/16   TatOctaviano Batty, Rebecca S, DO    Physical Exam: Vitals:   12/16/16 0200 12/16/16 0230 12/16/16 0300  12/16/16 0400  BP: 107/60 110/62 111/71 112/62  Pulse: 84 83 88 86  Resp: (!) 27 (!) 26 (!) 24 (!) 29  Temp:      TempSrc:      SpO2: 94% 97% 96% 96%  Weight:        Constitutional: NAD, calm, comfortable Eyes: PERRL, lids and conjunctivae normal ENMT: Mucous membranes are mildly dry. Posterior pharynx clear of any exudate or lesions. Neck: normal, supple, no masses, no thyromegaly Respiratory: clear to auscultation bilaterally, no wheezing, no crackles. Normal respiratory effort. No accessory muscle use.  Cardiovascular: Regular rate and rhythm, no murmurs / rubs / gallops. No extremity edema. 2+ pedal pulses. No carotid bruits.  Abdomen: soft, no tenderness, no masses palpated. No hepatosplenomegaly. Bowel sounds positive.  Musculoskeletal: no clubbing / cyanosis. Good ROM, no contractures. Normal muscle tone.  Skin: no rashes, lesions, ulcers on limited skin exam Neurologic: Basal tremor. CN 2-12 grossly intact. Sensation intact, DTR normal. Global weakness.  Psychiatric: Alert and oriented x 3. Depressed mood.     Labs on Admission: I have personally reviewed following labs and imaging studies  CBC:  Recent Labs Lab 12/15/16 2125 12/16/16 0522  WBC 22.1* 20.6*  NEUTROABS 17.6* PENDING  HGB 15.2 13.5  HCT 44.8 40.0  MCV 90.3 88.7  PLT 544* 516*   Basic Metabolic Panel:  Recent Labs Lab 12/15/16 2125  NA 135  K 5.2*  CL 101  CO2 23  GLUCOSE 109*  BUN 42*  CREATININE 1.99*  CALCIUM 8.4*   GFR: Estimated Creatinine Clearance: 26.8 mL/min (A) (by C-G formula based on SCr of 1.99 mg/dL (H)). Liver Function Tests:  Recent Labs Lab 12/15/16 2125  AST 21  ALT 5*  ALKPHOS 79  BILITOT 0.5  PROT 6.2*  ALBUMIN 3.2*   No results for input(s): LIPASE, AMYLASE in the last 168 hours. No results for input(s): AMMONIA in the last 168 hours. Coagulation Profile:  Recent Labs Lab 12/15/16 2125  INR 0.99   Cardiac Enzymes: No results for input(s): CKTOTAL,  CKMB, CKMBINDEX, TROPONINI in the last 168 hours. BNP (last 3 results) No results for input(s): PROBNP in the last 8760 hours. HbA1C: No results for input(s): HGBA1C in the last 72 hours. CBG:  Recent Labs Lab 12/15/16 2122  GLUCAP 111*   Lipid Profile: No results for input(s): CHOL, HDL, LDLCALC, TRIG, CHOLHDL, LDLDIRECT in the last 72 hours. Thyroid Function Tests: No results for input(s): TSH, T4TOTAL, FREET4, T3FREE, THYROIDAB in the last 72 hours. Anemia Panel: No results for input(s): VITAMINB12, FOLATE, FERRITIN, TIBC, IRON, RETICCTPCT in the last 72 hours. Urine analysis:    Component Value Date/Time   COLORURINE YELLOW 12/16/2016 0117   APPEARANCEUR TURBID (A) 12/16/2016 0117   LABSPEC 1.010 12/16/2016 0117   PHURINE 6.0 12/16/2016 0117   GLUCOSEU NEGATIVE 12/16/2016 0117   HGBUR MODERATE (A) 12/16/2016 0117   BILIRUBINUR NEGATIVE 12/16/2016 0117   KETONESUR NEGATIVE 12/16/2016 0117   PROTEINUR 100 (A) 12/16/2016 0117   NITRITE NEGATIVE 12/16/2016 0117   LEUKOCYTESUR LARGE (A) 12/16/2016 0117  Radiological Exams on Admission: Dg Chest 2 View  Result Date: 12/15/2016 CLINICAL DATA:  83 year old male with fever, decreased fluid intake and decreased urine output. EXAM: CHEST  2 VIEW COMPARISON:  CT Abdomen and Pelvis 06/03/2016 FINDINGS: Semi upright AP and lateral views of the chest. Somewhat low lung volumes. No pneumothorax or pneumoperitoneum. Normal cardiac size and mediastinal contours. No pulmonary edema, pleural effusion or confluent pulmonary opacity. Flowing osteophytes or syndesmophytes in the thoracic spine. Chronic left clavicle deformity. No acute osseous abnormality identified. Negative visible bowel gas pattern. Chronic 2 cm gallstone in the right upper quadrant. IMPRESSION: 1.  No acute cardiopulmonary abnormality. 2. Chronic cholelithiasis. Electronically Signed   By: Odessa FlemingH  Hall M.D.   On: 12/15/2016 21:59    EKG: Independently reviewed. Vent. rate 87  BPM PR interval * ms QRS duration 81 ms QT/QTc 380/458 ms P-R-T axes 44 -29 6 Sinus rhythm Borderline left axis deviation  Assessment/Plan Principal Problem:   Sepsis secondary to UTI (HCC) Admit to telemetry unit/inpatient. Continue IV fluids. Continue vancomycin and Zosyn per pharmacy. Follow-up blood and urine cultures. Adjust IV antibiotics once UC&S results are back.  Active Problems:   Paralysis agitans (HCC) Having multiple falls in the past few months. Family states that the patient is very weak for transfer. Continue Sinemet 25-100 milligrams 3 tablets by mouth twice a day. Consult physical therapy for evaluation.    Glaucoma Currently not using drops regularly. His wife stated that his most recent eye pressure was normal. Resume Xalatan drops 1 in the hospital.    Hypothyroid Continue Synthroid 100 g by mouth daily.    Hyperkalemia Continue IV fluids. Follow-up potassium level.    Constipation Continue MiraLAX and Colace.    DVT prophylaxis: Lovenox SQ. Code Status: Full code. Family Communication:  Berdine AddisonFrancis,Susan  [phone]  [phone]  Caryl ComesMarc Weiler [phone]  Disposition Plan: Admit for IV antibiotics for several days, PT and social services evaluation. Consults called: Physical therapy and social services. Admission status: Inpatient/telemetry.   Bobette Moavid Manuel Ortiz MD [facility] Pager [phone].  If 7PM-7AM, please contact night-coverage [email or web address] Password [email or web address]  12/16/2016, 5:55 AM

## 2016-12-16 NOTE — Clinical Social Work Note (Signed)
Clinical Social Work Assessment  Patient Details  Name: Stephen Cole MRN: [id number] Date of Birth: [birth year]  Date of referral:  12/16/16               Reason for consult:  Facility Placement                Permission sought to share information with:  Family Supports Permission granted to share information::     Name::       Yoshi, Mancillas  Agency::     Relationship::  Spouse  Contact Information:    [phone]  Housing/Transportation Living arrangements for the past 2 months:  Single Family Home Source of Information:  Spouse Patient Interpreter Needed:  None Criminal Activity/Legal Involvement Pertinent to Current Situation/Hospitalization:  No - Comment as needed Significant Relationships:  Spouse Lives with:  Spouse Do you feel safe going back to the place where you live?  Yes Need for family participation in patient care:  Yes (Comment)  Care giving concerns:  Short term Rehab Placement and hopeful to transition to Long term Placement.    Social Worker assessment / plan:   CSW met with patient spouse and son at bedside. Patient spouse inform CSW she is hopeful patient will be able get PT at SNF. She reports has been taking care of patient at home, and it is getting to be too much for her. She is a Equities trader. She reports it takes two plus assist to move patient at times, in the past three weeks has fell twice. Spouse reports she has talk with social workers at her Job about SNF's in the area. She provided CSW with a list of facilities and plans to tour and visit. Patient son inquired about BCBS-Medicare assist with cost. CSW educated patient about prior authorization and daily reviews during PT sessions. Patient spouse familiar with process.  Patient spouse provided CSW with POA paperwork, CSW made copies and placed them on patient chart for medical records.   Plan: Complete FL2, PASRR, Will need prior authorization from BCBS-Medicare, Assist with discharge to  SNF.  Employment status:  Retired Nurse, adult PT Recommendations:  Not assessed at this time Information / Referral to community resources:  [place]  Patient/Family's Response to care: Engineer, drilling of CSW services.   Patient/Family's Understanding of and Emotional Response to Diagnosis, Current Treatment, and Prognosis:  "Its getting hard to care for him at home, I Have to call my son to help me get him up when he falls or I use a gait belt." Patient understands patient is declining and needs more assistance.  Patient spouse educated and very involved in patient care.   Emotional Assessment Appearance:  Developmentally appropriate, Appears younger than stated age Attitude/Demeanor/Rapport:    Affect (typically observed):  Calm Orientation:  Oriented to Self, Oriented to Place Alcohol / Substance use:  Not Applicable Psych involvement (Current and /or in the community):  No (Comment)  Discharge Needs  Concerns to be addressed:  Discharge Planning Concerns Readmission within the last 30 days:  Yes Current discharge risk:  Dependent with Mobility Barriers to Discharge:  Continued Medical Work up   [employer], [place] 12/16/2016, 12:41 PM

## 2016-12-16 NOTE — Care Management Note (Addendum)
Case Management Note  Patient Details  Name: Stephen Cole MRN: [id number] Date of Birth: 28-Jul-1932  Subjective/Objective:     Sepsis in patient with hx of a ctbi/parkinsonism/decreased po intake and decreased urine outpt and recent hx of frequent falls.               Action/Plan: Date:  December 16, 2016 Chart reviewed for concurrent status and case management needs. Will continue to follow patient progress. Discharge Planning: following for needs/poss snf placement due to increasing failure to thrive. Lives at home with spouse at the present time. Expected discharge date: [id number] Marcelle SmilingRhonda Davis, BSN, [place], [place]   [phone]  Expected Discharge Date:                  Expected Discharge Plan:  Home/Self Care  In-House Referral:     Discharge planning Services  CM Consult  Post Acute Care Choice:    Choice offered to:     DME Arranged:    DME Agency:     HH Arranged:    HH Agency:     Status of Service:  In process, will continue to follow  If discussed at Long Length of Stay Meetings, dates discussed:    Additional Comments:  Golda AcreDavis, Rhonda Lynn, RN 12/16/2016, 8:55 AM

## 2016-12-16 NOTE — ED Notes (Signed)
Pt wife and son request to speak with social work r/t nursing home placement wife Glean HessSue Burks at [phone]  Home and [phone]. Son Caryl ComesMarc Weiler at [phone]

## 2016-12-16 NOTE — ED Notes (Signed)
Pt has to have a nurse in-out due to pt enlarge prostate

## 2016-12-16 NOTE — Progress Notes (Signed)
Patient bladder scan showed 550 cc's urine.  Charge nurse from urology floor in and out cathed with 20 french coude cath, and returned 450 cc's.

## 2016-12-16 NOTE — ED Provider Notes (Signed)
Patient signed out to me at shift change.  Patient having fever, decreased oral intake today.    WBC is 22.  Tachypneic, tachycardic, low grade fever 99.9.  Not hypotensive.  Will start sepsis protocol for unknown source.  No hypotensive and doesn't have >4 lactate.  Will hold weight based fluids, but will give a liter of fluid. Will start ABX.  CXR clear.  UA pending.  Appreciate Dr. Robb Matarrtiz for admitting the patient.  1. Urosepsis.       Roxy HorsemanBrowning, Arienne Gartin, PA-C 12/16/16 0206    Marily MemosMesner, Jason, MD 12/18/16 1528    Marily MemosMesner, Jason, MD 12/18/16 [phone]

## 2016-12-16 NOTE — Evaluation (Signed)
Physical Therapy Evaluation Patient Details Name: Stephen Cole MRN: [id number] DOB: [birth year] Today's Date: 12/16/2016   History of Present Illness  Stephen Cole is a 83 y.o. male with medical history significant of closed head injury, glaucoma, hypothyroidism, Parkinson's disease, chronic constipation who was brought to the emergency department via EMS 12/15/16 due to fever , decreased oral intake and urine output, progressively worse weakness , incontinence, multiple falls requiring assistance from EMS to lift him off the floor.   Clinical Impression  The  Patient responds with few words, noted rigidity of extremities. Requires 2  Person assist for mobility and transfers to recliner. Pt admitted with above diagnosis. Pt currently with functional limitations due to the deficits listed below (see PT Problem List).  Pt will benefit from skilled PT to increase their independence and safety with mobility to allow discharge to the venue listed below.       Follow Up Recommendations SNF;Supervision/Assistance - 24 hour    Equipment Recommendations  None recommended by PT    Recommendations for Other Services       Precautions / Restrictions Precautions Precautions: Fall Precaution Comments: incontinence      Mobility  Bed Mobility Overal bed mobility: Needs Assistance Bed Mobility: Supine to Sit     Supine to sit: Max assist;+2 for physical assistance;+2 for safety/equipment;HOB elevated     General bed mobility comments: patient demonstrates no initiation for activity  Transfers Overall transfer level: Needs assistance Equipment used: Rolling walker (2 wheeled) Transfers: Sit to/from [employer] CorporationStand;Stand Pivot Transfers Sit to Stand: Total assist;+2 physical assistance;+2 safety/equipment Stand pivot transfers: Total assist;+2 physical assistance;+2 safety/equipment       General transfer comment: 2 assist to rise and steady, decreased grip with right hand on RW, patient did not  take any steps, patient  slid feet  around as therapists turned the patient and assisted to sit down to the recliner. patient with rigid trunkand extremities.  Ambulation/Gait                Stairs            Wheelchair Mobility    Modified Rankin (Stroke Patients Only)       Balance Overall balance assessment: History of Falls;Needs assistance Sitting-balance support: Feet supported;Bilateral upper extremity supported Sitting balance-Leahy Scale: Poor Sitting balance - Comments: sits up with trunk control but gradually listing to the right.  Postural control: Right lateral lean Standing balance support: During functional activity                                 Pertinent Vitals/Pain Pain Assessment: Faces Faces Pain Scale: No hurt    Home Living Family/patient expects to be discharged to:: Private residence Living Arrangements: Spouse/significant other Available Help at Discharge: Family             Additional Comments: no family present  for information    Prior Function           Comments: per chart, recent falls and difficulty ambulating     Hand Dominance        Extremity/Trunk Assessment   Upper Extremity Assessment Upper Extremity Assessment: RUE deficits/detail;LUE deficits/detail RUE Deficits / Details: keeps hand fisted, unable to passively extend the  fingers, resting tremors, cogwheel rigidity LUE Deficits / Details: similar to right but hand will open up, resting tremors    Lower Extremity Assessment Lower Extremity Assessment: RLE deficits/detail;LLE deficits/detail RLE  Deficits / Details: rigid to ROM, , bears weight in standing LLE Deficits / Details: similar to right    Cervical / Trunk Assessment Cervical / Trunk Assessment: Kyphotic  Communication      Cognition Arousal/Alertness: Awake/alert Behavior During Therapy: Flat affect Overall Cognitive Status: No family/caregiver present to determine  baseline cognitive functioning                                 General Comments: oriented to WL, llodked at calendar which was on July 26.      General Comments      Exercises     Assessment/Plan    PT Assessment Patient needs continued PT services  PT Problem List Decreased strength;Decreased range of motion;Decreased activity tolerance;Decreased mobility;Decreased balance;Decreased knowledge of precautions;Decreased safety awareness;Decreased knowledge of use of DME;Decreased cognition       PT Treatment Interventions DME instruction;Gait training;Functional mobility training;Therapeutic activities;Patient/family education;Balance training    PT Goals (Current goals can be found in the Care Plan section)  Acute Rehab PT Goals PT Goal Formulation: Patient unable to participate in goal setting Time For Goal Achievement: 12/30/16 Potential to Achieve Goals: Fair    Frequency Min 3X/week   Barriers to discharge Decreased caregiver support      Co-evaluation               AM-PAC PT "6 Clicks" Daily Activity  Outcome Measure Difficulty turning over in bed (including adjusting bedclothes, sheets and blankets)?: Total Difficulty moving from lying on back to sitting on the side of the bed? : Total Difficulty sitting down on and standing up from a chair with arms (e.g., wheelchair, bedside commode, etc,.)?: Total Help needed moving to and from a bed to chair (including a wheelchair)?: Total Help needed walking in hospital room?: Total Help needed climbing 3-5 steps with a railing? : Total 6 Click Score: 6    End of Session Equipment Utilized During Treatment: Gait belt Activity Tolerance: Patient tolerated treatment well Patient left: in chair;with call bell/phone within reach;with chair alarm set Nurse Communication: Mobility status PT Visit Diagnosis: Difficulty in walking, not elsewhere classified (R26.2);Repeated falls (R29.6)    Time:  [id number] PT Time Calculation (min) (ACUTE ONLY): 21 min   Charges:   PT Evaluation $PT Eval Low Complexity: 1 Low     PT G CodesBlanchard Kelch:        Leilene Diprima PT [phone]   Rada HayHill, Nicholl Onstott Elizabeth 12/16/2016, 12:52 PM

## 2016-12-16 NOTE — Progress Notes (Signed)
Initial Nutrition Assessment  DOCUMENTATION CODES:   Non-severe (moderate) malnutrition in context of chronic illness  INTERVENTION:   Ensure Enlive po BID, each supplement provides 350 kcal and 20 grams of protein.   NUTRITION DIAGNOSIS:   Malnutrition (Moderate) related to chronic illness (Parkinson's diease ) as evidenced by moderate depletions of muscle mass, moderate depletion of body fat.  GOAL:   Patient will meet greater than or equal to 90% of their needs  MONITOR:   PO intake, Supplement acceptance, Labs, Weight trends  REASON FOR ASSESSMENT:   Low Braden   ASSESSMENT:   Pt with PMH of glaucoma, Parkinson's, chronic constipation, and closed head injury. Presents this admission sepsis secondary to UTI, and multiple falls.   Spoke with pt regarding nutrition history. Pt very reluctant to answer questions, giving one word answers. Pt denies any loss in appetite PTA. He recently consumed 100% of his breakfast.Reports a UBW of 160 lbs, the last time being at that wt unknown. Records indicate pt has maintained a body wt of 155 lb for almost a year.  Nutrition-Focused physical exam completed. Findings are moderate fat depletion in the orbital and thoracic/lumbar regions, severe muscle depletion in the upper arm area, moderate muscle depletion in the lower extremities, severe muscle depletion in the clavicle/temple regions, and no edema.   Given his limited nutrition history, it's hard to tell if pt actually has decrease in appetite. Wt is stable in records. The physical exam evidence supports that this pt is moderately malnourished and at a high risk for severely malnourished if appetite decreases.   Medications reviewed and include: colace, miralax, NS @ 100 ml/hr, IV abx Labs reviewed: BUN 41 Creatinine 2.13 Calcium 7.9  Diet Order:  Diet Heart Room service appropriate? Yes; Fluid consistency: Thin  Skin:  Reviewed, no issues  Last BM:  12/16/16  Height:   Ht  Readings from Last 1 Encounters:  09/06/16 5\' 7"  (1.702 m)    Weight:   Wt Readings from Last 1 Encounters:  12/16/16 154 lb 6.4 oz (70 kg)    Ideal Body Weight:  67.2 kg  BMI:  Body mass index is 24.18 kg/m.  Estimated Nutritional Needs:   Kcal:  1800-2000 (26-29 kcal/kg)  Protein:  105-115 grams (1.5-1.6 g/kg)  Fluid:  >1.8 L/day  EDUCATION NEEDS:   No education needs identified at this time  Vanessa Kickarly Ommie Degeorge RD, LDN Clinical Nutrition Pager # - [phone]

## 2016-12-16 NOTE — Progress Notes (Signed)
PROGRESS NOTE    Stephen Cole  ZOX:[id number] DOB: 1932/07/03 DOA: 12/15/2016 PCP: [name], [name], MD   Brief Narrative:  Stephen Cole is a 83 y.o. male with medical history significant of closed head injury, glaucoma, hypothyroidism, Parkinson's disease, chronic constipation who was brought to the emergency department via EMS due to fever since this morning, decreased oral intake and urine output. The patient does not provide much history at this time. His wife however, states that he has been having progressively worse weakness over the past 6 months, with multiple falls, sometimes requiring assistance from EMS to lift him off the floor. The patient only answers simple questions and does not elaborate on his symptoms. Patient is managed for sepsis secondary to UTI.  Assessment & Plan:   Principal Problem:   Sepsis secondary to UTI [facility](HCC) Active Problems:   Paralysis agitans (HCC)   Glaucoma   Hypothyroid   Hyperkalemia  Sepsis secondary to UTI (HCC)- with tachycardia and leukocytosis 22>> 20. - Will DC IV vancomycin and Zosyn 8/1 - Start IV ceftriaxone 8/1 >> - Continue IV fluids X 1 day - Follow-up blood and urine cultures.  Paralysis agitans (HCC)- Having multiple falls in the past few months, family states that the patient is very weak for transfer. Continue Sinemet 25-100 milligrams 3 tablets by mouth twice a day. - Physical therapy for evaluation- recommended SNF  -  social work working on placement   Mild AKI on CKD- creatinine 2.1. Last creatinine 1.86 months ago. Likely due to poor by mouth intake sepsis, a hydration. - Hydrate - BMp am  Thrombocytosis- 544 >>516. Without anemia - Follow-up as outpatient    Glaucoma Currently not using drops regularly. His wife stated that his most recent eye pressure was normal. Resume Xalatan drops 1 in the hospital.    Hypothyroid Continue Synthroid 100 g by mouth daily.    Hyperkalemia Continue IV fluids. Follow-up  potassium level.    Constipation Continue MiraLAX and Colace.  DVT prophylaxis: Lovenox SQ. Code Status: Full code. Family Communication:  Stephen Cole,Stephen Cole  [phone]  [phone]  Caryl ComesMarc Cole [phone]  Disposition Plan: Admit for IV antibiotics for several days, PT and social services evaluation. Consults called: Physical therapy and social services. Admission status: Inpatient/telemetry.  Consultants:   None  Procedures: None   Antimicrobials:   Iv vanc, iv Zosyn 8/1  IV rocephin 8/1 >>  Subjective: Single word answers to most simple questions. Denies chest pain shortness of breath . Significant other not present .   Objective: Vitals:   12/16/16 0600 12/16/16 0639 12/16/16 0641 12/16/16 1449  BP: 128/70 132/71  122/66  Pulse: 94 (!) 104  (!) 41  Resp: (!) 26 (!) 24    Temp:  98.1 F (36.7 C)  98.2 F (36.8 C)  TempSrc:  Oral  Oral  SpO2: 96% 95%  97%  Weight:   70 kg (154 lb 6.4 oz)     Intake/Output Summary (Last 24 hours) at 12/16/16 1514 Last data filed at 12/16/16 0907  Gross per 24 hour  Intake              370 ml  Output              600 ml  Net             -230 ml   Filed Weights   12/15/16 2131 12/16/16 0641  Weight: 71.2 kg (157 lb) 70 kg (154 lb 6.4 oz)    Examination:  General  exam: Appears calm and comfortable  Respiratory system: Clear to auscultation. Respiratory effort normal. Cardiovascular system: S1 & S2 heard, RRR. No JVD, murmurs, rubs, gallops or clicks. No pedal edema. Gastrointestinal system: Abdomen is nondistended, soft and nontender. No organomegaly or masses felt. Normal bowel sounds heard. Central nervous system: Alert and oriented. No focal neurological deficits. Extremities: Moving extremities spontaneously, fine tremor of upper extremities, more pronounced on the right Skin: No rashes, lesions or ulcers Psychiatry: Judgement and insight appear normal. Mood & affect appropriate.   Data Reviewed: I have  personally reviewed following labs and imaging studies  CBC:  Recent Labs Lab 12/15/16 2125 12/16/16 0522  WBC 22.1* 20.6*  NEUTROABS 17.6* 16.5*  HGB 15.2 13.5  HCT 44.8 40.0  MCV 90.3 88.7  PLT 544* 516*   Basic Metabolic Panel:  Recent Labs Lab 12/15/16 2125 12/16/16 0522  NA 135 135  K 5.2* 4.5  CL 101 105  CO2 23 20*  GLUCOSE 109* 109*  BUN 42* 41*  CREATININE 1.99* 2.13*  CALCIUM 8.4* 7.9*   Liver Function Tests:  Recent Labs Lab 12/15/16 2125  AST 21  ALT 5*  ALKPHOS 79  BILITOT 0.5  PROT 6.2*  ALBUMIN 3.2*   Coagulation Profile:  Recent Labs Lab 12/15/16 2125  INR 0.99   CBG:  Recent Labs Lab 12/15/16 2122  GLUCAP 111*   Sepsis Labs:  Recent Labs Lab 12/15/16 2137 12/16/16 0055  LATICACIDVEN 1.05 0.63   Radiology Studies: Dg Chest 2 View  Result Date: 12/15/2016 CLINICAL DATA:  83 year old male with fever, decreased fluid intake and decreased urine output. EXAM: CHEST  2 VIEW COMPARISON:  CT Abdomen and Pelvis 06/03/2016 FINDINGS: Semi upright AP and lateral views of the chest. Somewhat low lung volumes. No pneumothorax or pneumoperitoneum. Normal cardiac size and mediastinal contours. No pulmonary edema, pleural effusion or confluent pulmonary opacity. Flowing osteophytes or syndesmophytes in the thoracic spine. Chronic left clavicle deformity. No acute osseous abnormality identified. Negative visible bowel gas pattern. Chronic 2 cm gallstone in the right upper quadrant. IMPRESSION: 1.  No acute cardiopulmonary abnormality. 2. Chronic cholelithiasis. Electronically Signed   By: Odessa FlemingH  Hall M.D.   On: 12/15/2016 21:59   Scheduled Meds: . carbidopa-levodopa  3 tablet Oral BID  . docusate sodium  100 mg Oral Daily  . feeding supplement (ENSURE ENLIVE)  237 mL Oral BID BM  . heparin  5,000 Units Subcutaneous Q8H  . latanoprost  1 drop Both Eyes QHS  . levothyroxine  100 mcg Oral QAC breakfast  . polyethylene glycol  17 g Oral BID    Continuous Infusions: . sodium chloride 100 mL/hr at 12/16/16 0400  . cefTRIAXone (ROCEPHIN)  IV 2 g (12/16/16 1333)     LOS: 0 days   Onnie BoerEjiroghene E Pernella Ackerley, MD [facility] Pager [phone]  If 7PM-7AM, please contact night-coverage [email or web address] Password [email or web address] 12/16/2016, 3:14 PM

## 2016-12-16 NOTE — Progress Notes (Signed)
CSW left voicemail for patient spouse to discuss Nursing Home Placement.

## 2016-12-17 LAB — BASIC METABOLIC PANEL
ANION GAP: 8 (ref 5–15)
BUN: 39 mg/dL — ABNORMAL HIGH (ref 6–20)
CALCIUM: 8.5 mg/dL — AB (ref 8.9–10.3)
CO2: 25 mmol/L (ref 22–32)
CREATININE: 1.97 mg/dL — AB (ref 0.61–1.24)
Chloride: 108 mmol/L (ref 101–111)
GFR, EST AFRICAN AMERICAN: 35 mL/min — AB (ref >60)
GFR, EST NON AFRICAN AMERICAN: 30 mL/min — AB (ref >60)
GLUCOSE: 105 mg/dL — AB (ref 65–99)
Potassium: 4.1 mmol/L (ref 3.5–5.1)
Sodium: 141 mmol/L (ref 135–145)

## 2016-12-17 LAB — CBC WITH DIFFERENTIAL/PLATELET
BASOS ABS: 0.1 10*3/uL (ref 0.0–0.1)
BASOS PCT: 0 %
EOS PCT: 1 %
Eosinophils Absolute: 0.2 10*3/uL (ref 0.0–0.7)
HCT: 40 % (ref 39.0–52.0)
Hemoglobin: 13.1 g/dL (ref 13.0–17.0)
LYMPHS ABS: 3.1 10*3/uL (ref 0.7–4.0)
Lymphocytes Relative: 16 %
MCH: 30.2 pg (ref 26.0–34.0)
MCHC: 32.8 g/dL (ref 30.0–36.0)
MCV: 92.2 fL (ref 78.0–100.0)
MONO ABS: 0.8 10*3/uL (ref 0.1–1.0)
MONOS PCT: 4 %
Neutro Abs: 15.6 10*3/uL — ABNORMAL HIGH (ref 1.7–7.7)
Neutrophils Relative %: 79 %
PLATELETS: 488 10*3/uL — AB (ref 150–400)
RBC: 4.34 MIL/uL (ref 4.22–5.81)
RDW: 14 % (ref 11.5–15.5)
WBC: 19.7 10*3/uL — ABNORMAL HIGH (ref 4.0–10.5)

## 2016-12-17 NOTE — Progress Notes (Signed)
Pt is alert and oriented x3. He can follow commands. Per wife states that at home he uses walker and can ambulate short distances with help. Patient can feed himself as long as it is set up for him. Patient has had frequent falls at home recently.

## 2016-12-17 NOTE — Progress Notes (Signed)
PROGRESS NOTE    Stephen Cole  [id number] DOB: December 16, 1932 DOA: 12/15/2016 PCP: [name], [name], MD   Brief Narrative:  Stephen Cole is a 83 y.o. male with medical history significant of closed head injury, glaucoma, hypothyroidism, Parkinson's disease, chronic constipation who was brought to the emergency department via EMS due to fever since this morning, decreased oral intake and urine output. The patient does not provide much history at this time. His wife however, states that he has been having progressively worse weakness over the past 6 months, with multiple falls, sometimes requiring assistance from EMS to lift him off the floor. The patient only answers simple questions and does not elaborate on his symptoms. Patient is managed for sepsis secondary to UTI.  Assessment & Plan:   Principal Problem:   Sepsis secondary to UTI [facility](HCC) Active Problems:   Paralysis agitans (HCC)   Glaucoma   Hypothyroid   Hyperkalemia  Sepsis secondary to UTI (HCC)- with tachycardia and leukocytosis 22>> 19. WBCs 05/2016- 29.  - Will DC IV vancomycin and Zosyn 8/1 - Start IV ceftriaxone 8/1 >> - Follow-up blood cultures - urine cultures, erronously not drawn on admission has been ordered today on original urine sample.  Parkinson's disease (HCC)- Having multiple falls in the past few months, family states that the patient is very weak for transfer. Follows with neurologist Dr. Arbutus Leasat. Continue Sinemet 25-100 milligrams 3 tablets by mouth twice a day. - Physical therapy for evaluation- recommended SNF  - social work working on placement  - Swallow eval  Mild AKI on CKD- creatinine 1.9>2.1>1.9. With hydration. Last creatinine 1.8 6 months ago. Likely due to poor by mouth intake sepsis, dehydration. - DC IV fluids taking by mouth  Leukocytosis, Thrombocytosis- wbc mild trend down 22>19, platelets 488 today. This appeared to be chronic. Last cbc- 6 month ago revealed wbc- 29, with elevated absolute  neutrophil count platelets- 417. - Follow-up as outpatient, with PCP and possibly hematologist    Glaucoma Currently not using drops regularly. His wife stated that his most recent eye pressure was normal. Resume Xalatan drops 1 in the hospital.    Hypothyroid Continue Synthroid 100 g by mouth daily.    Hyperkalemia Continue IV fluids. Follow-up potassium level.    Constipation Continue MiraLAX and Colace.  DVT prophylaxis: Lovenox SQ. Code Status: Full code. Family Communication:  Berdine AddisonFrancis,Susan  [phone]  [phone]  Caryl ComesMarc Weiler [phone]  Disposition Plan: Admit for IV antibiotics for several days, PT and social services evaluation. Consults called: Physical therapy and social services. Admission status: Inpatient/telemetry.  Consultants:   None  Procedures: None   Antimicrobials:   Iv vanc, iv Zosyn 8/1  IV rocephin 8/1 >>  Subjective: No complaints today  Objective: Vitals:   12/16/16 0641 12/16/16 1449 12/16/16 2120 12/17/16 0605  BP:  122/66 132/73 122/74  Pulse:  (!) 41 98 96  Resp:   20 18  Temp:  98.2 F (36.8 C) 98.4 F (36.9 C) 98.7 F (37.1 C)  TempSrc:  Oral Oral Oral  SpO2:  97% 98% 95%  Weight: 70 kg (154 lb 6.4 oz)       Intake/Output Summary (Last 24 hours) at 12/17/16 1438 Last data filed at 12/17/16 0828  Gross per 24 hour  Intake          1658.33 ml  Output             3375 ml  Net         -1716.67 ml  Filed Weights   12/15/16 2131 12/16/16 0641  Weight: 71.2 kg (157 lb) 70 kg (154 lb 6.4 oz)    Examination:  General exam: Appears calm and comfortable  Respiratory system: Clear to auscultation. Respiratory effort normal. Cardiovascular system: S1 & S2 heard, RRR. No JVD, murmurs, rubs, gallops or clicks. No pedal edema. Gastrointestinal system: Abdomen is nondistended, soft and nontender. No organomegaly or masses felt. Normal bowel sounds heard. Central nervous system: Alert and oriented. No focal  neurological deficits. Extremities: Moving extremities spontaneously, fine tremor of upper extremities, more pronounced on the right Skin: No rashes, lesions or ulcers Psychiatry: Judgement and insight appear normal. Mood & affect appropriate.   Data Reviewed: I have personally reviewed following labs and imaging studies  CBC:  Recent Labs Lab 12/15/16 2125 12/16/16 0522 12/17/16 0800  WBC 22.1* 20.6* 19.7*  NEUTROABS 17.6* 16.5* 15.6*  HGB 15.2 13.5 13.1  HCT 44.8 40.0 40.0  MCV 90.3 88.7 92.2  PLT 544* 516* 488*   Basic Metabolic Panel:  Recent Labs Lab 12/15/16 2125 12/16/16 0522 12/17/16 0800  NA 135 135 141  K 5.2* 4.5 4.1  CL 101 105 108  CO2 23 20* 25  GLUCOSE 109* 109* 105*  BUN 42* 41* 39*  CREATININE 1.99* 2.13* 1.97*  CALCIUM 8.4* 7.9* 8.5*   Liver Function Tests:  Recent Labs Lab 12/15/16 2125  AST 21  ALT 5*  ALKPHOS 79  BILITOT 0.5  PROT 6.2*  ALBUMIN 3.2*   Coagulation Profile:  Recent Labs Lab 12/15/16 2125  INR 0.99   CBG:  Recent Labs Lab 12/15/16 2122  GLUCAP 111*   Sepsis Labs:  Recent Labs Lab 12/15/16 2137 12/16/16 0055  LATICACIDVEN 1.05 0.63   Radiology Studies: Dg Chest 2 View  Result Date: 12/15/2016 CLINICAL DATA:  83 year old male with fever, decreased fluid intake and decreased urine output. EXAM: CHEST  2 VIEW COMPARISON:  CT Abdomen and Pelvis 06/03/2016 FINDINGS: Semi upright AP and lateral views of the chest. Somewhat low lung volumes. No pneumothorax or pneumoperitoneum. Normal cardiac size and mediastinal contours. No pulmonary edema, pleural effusion or confluent pulmonary opacity. Flowing osteophytes or syndesmophytes in the thoracic spine. Chronic left clavicle deformity. No acute osseous abnormality identified. Negative visible bowel gas pattern. Chronic 2 cm gallstone in the right upper quadrant. IMPRESSION: 1.  No acute cardiopulmonary abnormality. 2. Chronic cholelithiasis. Electronically Signed   By:  Odessa FlemingH  Hall M.D.   On: 12/15/2016 21:59   Scheduled Meds: . carbidopa-levodopa  3 tablet Oral BID  . docusate sodium  100 mg Oral Daily  . feeding supplement (ENSURE ENLIVE)  237 mL Oral BID BM  . heparin  5,000 Units Subcutaneous Q8H  . latanoprost  1 drop Both Eyes QHS  . levothyroxine  100 mcg Oral QAC breakfast  . polyethylene glycol  17 g Oral BID   Continuous Infusions: . cefTRIAXone (ROCEPHIN)  IV Stopped (12/16/16 1629)     LOS: 1 day   Onnie BoerEjiroghene E Kaniyah Lisby, MD [facility] Pager [phone]  If 7PM-7AM, please contact night-coverage [email or web address] Password [email or web address] 12/17/2016, 2:38 PM

## 2016-12-17 NOTE — Evaluation (Signed)
Clinical/Bedside Swallow Evaluation Patient Details  Name: Stephen Cole MRN: [id number] Date of Birth: [birth year]  Today's Date: 12/17/2016 Time: SLP Start Time (ACUTE ONLY): 1700 SLP Stop Time (ACUTE ONLY): 1733 SLP Time Calculation (min) (ACUTE ONLY): 33 min  Past Medical History:  Past Medical History:  Diagnosis Date  . Closed head injury 1991  . Glaucoma   . Hypothyroid   . Parkinson disease [facility](HCC)    Past Surgical History:  Past Surgical History:  Procedure Laterality Date  . partial thumb amputation     HPI:  83 yo male [facility] with decreased urine output and urinary retention.  PMH + for Parkinsons disease, ? pna in January 2018.  Swallow evaluation ordered as RN noted deficits in swallow.  CXR completed upon admission negative.  Imaging study from 05/2016 concerning for possible pna.  Pt denies weight loss nor recent pneumonias due to dysphagia.  He does have a MOST form sign indicating no desire for feeding tube.  No family present during evaluation.    Assessment / Plan / Recommendation Clinical Impression  Pt presents with symptoms consistent with dysphagia in sequela with Parkinsons disease. Possible delayed pharyngeal reflex, decrease muscle contraction contributing to residuals and compromised airway closure.  Multiple swallows noted across all boluses.    Pt does feed himself slowly and masticates well.  He reports problems with pill today getting "stuck" in throat.  Advised currently to crush large pills if not contrindicated due to aspiration risk with deconditioning/current illness.  Nurse reports pt masticating pills anyway - ? Compensatory or cognitive based?    Pt does present with frequent throat clearing and occasional coughing with intake - he reports this to be baseline.    Given pt had possible pna in January 2018- and symptoms of dysphagia - would recommend MBS. MBS would allow instrumental eval and provide functional strategies that may mitigate symptoms.      Pt however stated "No, no more tests" despite SLP informing him of benefits.  Uncertain to cognitive function of pt given his "mastication" of pills and lack of responses at times to this SLP.  Therefore will follow up next date to speak to pt/familyMD to determine if pt willing to undergo testing.    Educated pt to aspiration precautions/dysphagia mitigation strategies in writing.  At one point pt stated "Can't you just sit there and be quiet" therefore suspect overwhelmed pt.  Continue soft diet, medicine crushed if large with aspiration/esophageal precautions.    SLP Visit Diagnosis: Dysphagia, oropharyngeal phase (R13.12)    Aspiration Risk       Diet Recommendation Dysphagia 3 (Mech soft);Thin liquid   Liquid Administration via: Cup;Straw Medication Administration: Crushed with puree Supervision: Patient able to self feed Compensations: Slow rate;Small sips/bites;Multiple dry swallows after each bite/sip (pt conducts dry swallows independently) Postural Changes: Remain upright for at least 30 minutes after po intake;Seated upright at 90 degrees    Other  Recommendations Oral Care Recommendations: Oral care BID   Follow up Recommendations  (tbd)      Frequency and Duration     (defer until MBS if pt agreeable)       Prognosis Prognosis for Safe Diet Advancement: Fair Barriers to Reach Goals: Time post onset      Swallow Study   General Date of Onset: 12/17/16 HPI: 83 [facility] with decreased urine output and urinary retention.  PMH + for Parkinsons disease, ? pna in January 2018.  Swallow evaluation ordered as  RN noted deficits in swallow.  CXR completed upon admission negative.  Imaging study from 05/2016 concerning for possible pna.  Pt denies weight loss nor recent pneumonias due to dysphagia.  He does have a MOST form sign indicating no desire for feeding tube.  No family present during evaluation.  Type of Study: Bedside Swallow Evaluation Diet Prior to this  Study: Regular;Thin liquids (pt with soft food items on plate) Temperature Spikes Noted: No Respiratory Status: Nasal cannula History of Recent Intubation: No Behavior/Cognition: Alert;Cooperative;Other (Comment) (? flat affect, hypomimia, delayed responses) Oral Cavity Assessment: Within Functional Limits Oral Care Completed by SLP: No Oral Cavity - Dentition: Adequate natural dentition Vision: Functional for self-feeding Self-Feeding Abilities: Able to feed self;Needs set up Patient Positioning: Upright in bed Baseline Vocal Quality: Low vocal intensity Volitional Cough: Weak Volitional Swallow:  (dnt)    Oral/Motor/Sensory Function Overall Oral Motor/Sensory Function: Generalized oral weakness (limited lingual ROM)   Ice Chips Ice chips: Not tested Other Comments: pt prefers room temperature items   Thin Liquid Thin Liquid: Impaired Presentation: Cup;Self Fed;Straw Oral Phase Impairments: Reduced lingual movement/coordination Pharyngeal  Phase Impairments: Multiple swallows;Throat Clearing - Delayed;Cough - Delayed    Nectar Thick Nectar Thick Liquid: Not tested   Honey Thick Honey Thick Liquid: Not tested   Puree Puree: Impaired Presentation: Self Fed;Spoon Pharyngeal Phase Impairments: Multiple swallows   Solid   GO   Solid: Impaired Presentation: Self Fed Oral Phase Impairments: Reduced lingual movement/coordination Pharyngeal Phase Impairments: Multiple swallows;Throat Clearing - Delayed        Chales AbrahamsKimball, Verginia Toohey Ann 12/17/2016,6:02 PM   Donavan Burnetamara Hyla Coard, MS [facility] SLP [phone]

## 2016-12-17 NOTE — Progress Notes (Signed)
CSW met with spouse and daughter at bedside, discussed SNF choices. Patient spouse and daughter plan to follow up choice in the a.m.   Kathrin Greathouse, Latanya Presser, MSW Clinical Social Worker 5E and Psychiatric Service Line [phone] 12/17/2016  4:28 PM

## 2016-12-17 NOTE — NC FL2 (Signed)
[place] MEDICAID FL2 LEVEL OF CARE SCREENING TOOL     IDENTIFICATION  Patient Name: Stephen KalataFrancis Cole Birthdate: [birth year] Sex: male Admission Date (Current Location): 12/15/2016  [facility] [place]   Facility and Address:  [facility],  [address], [place] [id number]      Provider Number: [id number]  Attending Physician Name and Address:  Onnie BoerEmokpae, Ejiroghene E, MD  Relative Name and Phone Number:  Berdine AddisonFrancis,Susan  [phone]     Current Level of Care: SNF Recommended Level of Care: Skilled Nursing Facility Prior Approval Number:    Date Approved/Denied:   PASRR Number: [id number]   Discharge Plan: SNF    Current Diagnoses: Patient Active Problem List   Diagnosis Date Noted  . Sepsis secondary to UTI (HCC) 12/16/2016  . Glaucoma 12/16/2016  . Hypothyroid 12/16/2016  . Hyperkalemia 12/16/2016  . History of traumatic brain injury 08/15/2013  . Paralysis agitans (HCC) 01/10/2013  . 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 01/10/2013    Orientation RESPIRATION BLADDER Height & Weight     Self, Place  Normal Incontinent Weight: 154 lb 6.4 oz (70 kg) Height:     BEHAVIORAL SYMPTOMS/MOOD NEUROLOGICAL BOWEL NUTRITION STATUS      Continent Diet Dysphagia 3 (Mech soft);Thin liquid   AMBULATORY STATUS COMMUNICATION OF NEEDS Skin     Verbally Normal                       Personal Care Assistance Level of Assistance  Bathing, Feeding, Dressing Bathing Assistance: Limited assistance Feeding assistance: Independent Dressing Assistance: Limited assistance     Functional Limitations Info  Sight, Hearing, Speech Sight Info: Adequate Hearing Info: Adequate Speech Info: Adequate    SPECIAL CARE FACTORS FREQUENCY  PT (By licensed PT), OT (By licensed OT)     PT Frequency: Min 3X/week              Contractures Contractures Info: Present    Additional Factors Info  Code Status, Allergies Code Status Info: Fullcode Allergies Info:  No Known Allergies           Current Medications (12/17/2016):  This is the current hospital active medication list Current Facility-Administered Medications  Medication Dose Route Frequency Provider Last Rate Last Dose  . carbidopa-levodopa (SINEMET IR) 25-100 MG per tablet immediate release 3 tablet  3 tablet Oral BID Bobette Mortiz, David Manuel, MD   3 tablet at 12/16/16 2138  . cefTRIAXone (ROCEPHIN) 2 g in dextrose 5 % 50 mL IVPB  2 g Intravenous Q24H Emokpae, Ejiroghene E, MD   Stopped at 12/16/16 1629  . docusate sodium (COLACE) capsule 100 mg  100 mg Oral Daily Bobette Mortiz, David Manuel, MD   100 mg at 12/16/16 [id number]  . feeding supplement (ENSURE ENLIVE) (ENSURE ENLIVE) liquid 237 mL  237 mL Oral BID BM Emokpae, Ejiroghene E, MD   237 mL at 12/16/16 1340  . heparin injection 5,000 Units  5,000 Units Subcutaneous Q8H Bobette Mortiz, David Manuel, MD   5,000 Units at 12/17/16 [phone]  . latanoprost (XALATAN) 0.005 % ophthalmic solution 1 drop  1 drop Both Eyes QHS Bobette Mortiz, David Manuel, MD   1 drop at 12/16/16 2141  . levothyroxine (SYNTHROID, LEVOTHROID) tablet 100 mcg  100 mcg Oral QAC breakfast Bobette Mortiz, David Manuel, MD   100 mcg at 12/16/16 [id number]  . polyethylene glycol (MIRALAX / GLYCOLAX) packet 17 g  17 g Oral BID Emokpae, Ejiroghene E, MD   17 g at 12/16/16 2138  Discharge Medications: Please see discharge summary for a list of discharge medications.  Relevant Imaging Results:  Relevant Lab Results:   Additional Information ssn: [id number]  Clearance CootsNicole A Samiyah Stupka, LCSW

## 2016-12-18 LAB — CBC
HEMATOCRIT: 38.4 % — AB (ref 39.0–52.0)
Hemoglobin: 12.7 g/dL — ABNORMAL LOW (ref 13.0–17.0)
MCH: 30.3 pg (ref 26.0–34.0)
MCHC: 33.1 g/dL (ref 30.0–36.0)
MCV: 91.6 fL (ref 78.0–100.0)
Platelets: 504 10*3/uL — ABNORMAL HIGH (ref 150–400)
RBC: 4.19 MIL/uL — ABNORMAL LOW (ref 4.22–5.81)
RDW: 14.1 % (ref 11.5–15.5)
WBC: 15.8 10*3/uL — ABNORMAL HIGH (ref 4.0–10.5)

## 2016-12-18 MED ORDER — DEXTROSE 5 % IV SOLN
1.0000 g | INTRAVENOUS | Status: DC
  Administered 2016-12-18: 1 g via INTRAVENOUS
  Filled 2016-12-18 (×2): qty 1

## 2016-12-18 NOTE — Progress Notes (Signed)
[facility] CSW received BCBS-Medicare Authorization [id number], starting discharge date: 12/19/2016. CSW provided information to Fritzi MandesKirsten at [facility].  Weekend CSW to assist with patient discharge.   Vivi BarrackNicole Monna Crean, Theresia MajorsLCSWA, MSW Clinical Social Worker 5E and Psychiatric Service Line [phone] 12/18/2016  10:25 AM

## 2016-12-18 NOTE — Progress Notes (Signed)
Pharmacy Antibiotic Note  Stephen Cole is a 83 y.o. male admitted on 12/15/2016 with Urosepsis initially treated with Vanc/Zosyn x1 dose then Ceftriaxone.  Pharmacy has been consulted now for Cefepime dosing for Pseudomonas in urine culture.  Plan: Cefepime 1g IV q24h Follow up renal fxn, culture results, and clinical course.   Weight: 154 lb 6.4 oz (70 kg)  Temp (24hrs), Avg:98.4 F (36.9 C), Min:97.9 F (36.6 C), Max:99.1 F (37.3 C)   Recent Labs Lab 12/15/16 2125 12/15/16 2137 12/16/16 0055 12/16/16 0522 12/17/16 0800 12/18/16 0556  WBC 22.1*  --   --  20.6* 19.7* 15.8*  CREATININE 1.99*  --   --  2.13* 1.97*  --   LATICACIDVEN  --  1.05 0.63  --   --   --     Estimated Creatinine Clearance: 27 mL/min (A) (by C-G formula based on SCr of 1.97 mg/dL (H)).    No Known Allergies  Antimicrobials this admission: 8/1 Vanc >> 8/1 8/1 Zosyn >> 8/1 8/1 CTX >> 8/3 8/3 Cefepime >>  Microbiology results: 7/31 BCx: ngtd 8/1 UCx: 40k Pseudomonas aeruginosa  Thank you for allowing pharmacy to be a part of this patient's care.  Lynann Beaverhristine Joesphine Schemm PharmD, BCPS Pager [phone] 12/18/2016 12:06 PM

## 2016-12-18 NOTE — Progress Notes (Signed)
PT Cancellation Note  Patient Details Name: Stephen KalataFrancis Cole MRN: [id number] DOB: [birth year]   Cancelled Treatment:    Reason Eval/Treat Not Completed: Patient declined, no reason specified   Rebeca AlertJannie Darshay Deupree, MPT Pager: [phone]

## 2016-12-18 NOTE — Progress Notes (Signed)
  Speech Language Pathology Treatment: Dysphagia  Patient Details Name: Stephen Cole MRN: [id number] DOB: 10-14-1932 Today's Date: 12/18/2016 Time: [id number] SLP Time Calculation (min) (ACUTE ONLY): 15 min  Assessment / Plan / Recommendation Clinical Impression  F/u after yesterday's bedside swallow evaluation.  Pt feeding himself in bed, head reclined, POs spilling from mouth without awareness.  HOB elevated, pt provided with towel, repositioned tray to optimize feeding.  Pt continued to feed himself slowly.  Minimal spontaneity with regard to verbal interactions.  He answered basic questions but did not initiate communication; masked facies present.  No overt s/s of aspiration present today, but given presence of Parkinson's symptoms and generalized weakness, he is certainly at higher risk and would benefit from an MBS (to determine safest diet but also determine therapeutic intervention.) Pt declines as yesterday.    For likely D/C to SNF tomorrow - recommend SLP f/u for dysphagia; pt would benefit from RMT.  If agreeable, recommend returning for OP MBS in near future.  Discussed with family (I believe it was a stepdaughter who was present near end of session).     HPI HPI: 83 [facility] with decreased urine output and urinary retention.  PMH + for Parkinsons disease, ? pna in January 2018.  Swallow evaluation ordered as RN noted deficits in swallow.  CXR completed upon admission negative.  Imaging study from 05/2016 concerning for possible pna.  Pt denies weight loss nor recent pneumonias due to dysphagia.  He does have a MOST form sign indicating no desire for feeding tube.  No family present during evaluation.       SLP Plan  Discharge SLP treatment due to (comment) (d/c to SNF tomorrow)       Recommendations  Diet recommendations: Thin liquid;Dysphagia 3 (mechanical soft) Liquids provided via: Cup Medication Administration: Crushed with puree Compensations: Slow rate;Small  sips/bites;Multiple dry swallows after each bite/sip Postural Changes and/or Swallow Maneuvers: Seated upright 90 degrees                Oral Care Recommendations: Oral care BID SLP Visit Diagnosis: Dysphagia, oropharyngeal phase (R13.12) Plan: Discharge SLP treatment due to (comment) (d/c to SNF tomorrow)       GO                Blenda Mountsouture, Cecia Egge Laurice 12/18/2016, 12:44 PM

## 2016-12-18 NOTE — Progress Notes (Signed)
PROGRESS NOTE    Stephen Cole  ZOX:[id number] DOB: [birth year] DOA: 12/15/2016 PCP: [name], [name], MD   Brief Narrative:  Stephen Cole is a 83 y.o. male with medical history significant of closed head injury, glaucoma, hypothyroidism, Parkinson's disease, chronic constipation who was brought to the emergency department via EMS due to fever since this morning, decreased oral intake and urine output. The patient does not provide much history at this time. His wife however, states that he has been having progressively worse weakness over the past 6 months, with multiple falls, sometimes requiring assistance from EMS to lift him off the floor. The patient only answers simple questions and does not elaborate on his symptoms. Patient is managed for sepsis secondary to UTI.  Assessment & Plan:   Principal Problem:   Sepsis secondary to UTI [facility](HCC) Active Problems:   Paralysis agitans (HCC)   Glaucoma   Hypothyroid   Hyperkalemia  Sepsis secondary to UTI (HCC)- with tachycardia and leukocytosis 22>> 19. WBCs 05/2016- 29. Resolving sepsis physiology. IV vancomycin and Zosyn 8/1 started on admission d/c. - Following urine cultures of Pseudomonas- Switch Iv antibiotics to cefepime 8/3 >> - D/c IV ceftriaxone 8/1 >>8/3 - Follow-up blood cultures, so far negative - urine cultures, erronously not drawn on admission was ordered 8/2 on original urine sample. - SNF bed available, awaiting Culture sensitivities.  Parkinson's disease (HCC)- Having multiple falls in the past few months, family states that the patient is very weak for transfer. Follows with neurologist Dr. Arbutus Leasat. Continue Sinemet 25-100 milligrams 3 tablets by mouth twice a day. - Physical therapy for evaluation- recommended SNF  - social work working on placement  - [facility]allow [facility]- [facility], dysphagia 3 diet recommeneded - Pt's wife has been doing in and out cath at home for BPH, he has a urologist. Wife is  a Char[employer] fundraiserN "?retired"  Mild AKI on CKD stage 3 creatinine 1.9>2.1>1.9. With hydration. Last creatinine 1.8 6 months ago. Likely due to poor by mouth intake sepsis, dehydration. - DC IV fluids taking by mouth  Leukocytosis, Thrombocytosis- wbc trend down 22>15, platelets 488 today. This appeared to be chronic. Last cbc- 6 month ago revealed wbc- 29, with elevated absolute neutrophil count platelets- 417. Might have myeloproliferative dsd. - Follow-up as outpatient, with PCP and possibly hematologist    Glaucoma Currently not using drops regularly. His wife stated that his most recent eye pressure was normal. Resume Xalatan drops 1 in the hospital.    Hypothyroid Continue Synthroid 100 g by mouth daily.    Hyperkalemia Continue IV fluids. Follow-up potassium level.    Constipation Continue MiraLAX and Colace.  DVT prophylaxis: Lovenox SQ. Code Status: Full code. Family Communication:  Stephen Cole,Stephen Cole  [phone]  [phone]  Stephen Cole [phone]  Disposition Plan: Awaiting culture sensitivities. Consults called: Physical therapy and social services. Admission status: Inpatient/telemetry.  Consultants:   None  Procedures: None   Antimicrobials:   Iv vanc, iv Zosyn 8/1  IV rocephin 8/1 >>  Subjective: No complaints today  Objective: Vitals:   12/17/16 1443 12/17/16 2056 12/18/16 0452 12/18/16 1527  BP: 120/66 130/74 (!) 152/79 (!) 149/81  Pulse: 94 91 91 (!) 107  Resp: 18 18 18 18   Temp: 99.1 F (37.3 C) 97.9 F (36.6 C) 98.3 F (36.8 C) 98.5 F (36.9 C)  TempSrc: Axillary Oral Oral Oral  SpO2: 97% 97% 94% 97%  Weight:        Intake/Output Summary (Last 24 hours) at  12/18/16 1745 Last data filed at 12/18/16 1500  Gross per 24 hour  Intake              390 ml  Output             2450 ml  Net            -2060 ml   Filed Weights   12/15/16 2131 12/16/16 0641  Weight: 71.2 kg (157 lb) 70 kg (154 lb 6.4 oz)    Examination:  General exam:  Appears calm and comfortable  Respiratory system: Clear to auscultation. Respiratory effort normal. Cardiovascular system: S1 & S2 heard, RRR. No JVD, murmurs, rubs, gallops or clicks. No pedal edema. Gastrointestinal system: Abdomen is nondistended, soft and nontender. No organomegaly or masses felt. Normal bowel sounds heard. Central nervous system: Alert and oriented. No focal neurological deficits. Extremities: Moving extremities spontaneously, fine tremor of upper extremities, more pronounced on the right Skin: No rashes, lesions or ulcers Psychiatry: Judgement and insight appear normal. Mood & affect appropriate.   Data Reviewed: I have personally reviewed following labs and imaging studies  CBC:  Recent Labs Lab 12/15/16 2125 12/16/16 0522 12/17/16 0800 12/18/16 0556  WBC 22.1* 20.6* 19.7* 15.8*  NEUTROABS 17.6* 16.5* 15.6*  --   HGB 15.2 13.5 13.1 12.7*  HCT 44.8 40.0 40.0 38.4*  MCV 90.3 88.7 92.2 91.6  PLT 544* 516* 488* 504*   Basic Metabolic Panel:  Recent Labs Lab 12/15/16 2125 12/16/16 0522 12/17/16 0800  NA 135 135 141  K 5.2* 4.5 4.1  CL 101 105 108  CO2 23 20* 25  GLUCOSE 109* 109* 105*  BUN 42* 41* 39*  CREATININE 1.99* 2.13* 1.97*  CALCIUM 8.4* 7.9* 8.5*   Liver Function Tests:  Recent Labs Lab 12/15/16 2125  AST 21  ALT 5*  ALKPHOS 79  BILITOT 0.5  PROT 6.2*  ALBUMIN 3.2*   Coagulation Profile:  Recent Labs Lab 12/15/16 2125  INR 0.99   CBG:  Recent Labs Lab 12/15/16 2122  GLUCAP 111*   Sepsis Labs:  Recent Labs Lab 12/15/16 2137 12/16/16 0055  LATICACIDVEN 1.05 0.63   Radiology Studies: No results found. Scheduled Meds: . carbidopa-levodopa  3 tablet Oral BID  . docusate sodium  100 mg Oral Daily  . feeding supplement (ENSURE ENLIVE)  237 mL Oral BID BM  . heparin  5,000 Units Subcutaneous Q8H  . latanoprost  1 drop Both Eyes QHS  . levothyroxine  100 mcg Oral QAC breakfast  . polyethylene glycol  17 g Oral BID     Continuous Infusions: . ceFEPime (MAXIPIME) IV Stopped (12/18/16 1453)     LOS: 2 days   Onnie BoerEjiroghene E Nikoletta Varma, MD [facility] Pager [phone]  If 7PM-7AM, please contact night-coverage [email or web address] Password [email or web address] 12/18/2016, 5:45 PM

## 2016-12-19 DIAGNOSIS — N390 Urinary tract infection, site not specified: Secondary | ICD-10-CM | POA: Diagnosis not present

## 2016-12-19 DIAGNOSIS — A419 Sepsis, unspecified organism: Secondary | ICD-10-CM | POA: Diagnosis not present

## 2016-12-19 DIAGNOSIS — R531 Weakness: Secondary | ICD-10-CM | POA: Diagnosis not present

## 2016-12-19 DIAGNOSIS — F0390 Unspecified dementia without behavioral disturbance: Secondary | ICD-10-CM | POA: Diagnosis not present

## 2016-12-19 DIAGNOSIS — M6281 Muscle weakness (generalized): Secondary | ICD-10-CM | POA: Diagnosis not present

## 2016-12-19 DIAGNOSIS — A4152 Sepsis due to Pseudomonas: Secondary | ICD-10-CM | POA: Diagnosis not present

## 2016-12-19 DIAGNOSIS — G20 Parkinson's disease: Secondary | ICD-10-CM | POA: Diagnosis not present

## 2016-12-19 DIAGNOSIS — R488 Other symbolic dysfunctions: Secondary | ICD-10-CM | POA: Diagnosis not present

## 2016-12-19 DIAGNOSIS — R2689 Other abnormalities of gait and mobility: Secondary | ICD-10-CM | POA: Diagnosis not present

## 2016-12-19 DIAGNOSIS — E875 Hyperkalemia: Secondary | ICD-10-CM

## 2016-12-19 DIAGNOSIS — R278 Other lack of coordination: Secondary | ICD-10-CM | POA: Diagnosis not present

## 2016-12-19 DIAGNOSIS — K5900 Constipation, unspecified: Secondary | ICD-10-CM | POA: Diagnosis not present

## 2016-12-19 DIAGNOSIS — R4182 Altered mental status, unspecified: Secondary | ICD-10-CM | POA: Diagnosis not present

## 2016-12-19 DIAGNOSIS — R1319 Other dysphagia: Secondary | ICD-10-CM | POA: Diagnosis not present

## 2016-12-19 DIAGNOSIS — E038 Other specified hypothyroidism: Secondary | ICD-10-CM

## 2016-12-19 DIAGNOSIS — R630 Anorexia: Secondary | ICD-10-CM | POA: Diagnosis not present

## 2016-12-19 DIAGNOSIS — R6520 Severe sepsis without septic shock: Secondary | ICD-10-CM | POA: Diagnosis not present

## 2016-12-19 LAB — URINE CULTURE: Culture: 40000 — AB

## 2016-12-19 MED ORDER — CIPROFLOXACIN HCL 500 MG PO TABS: 500.0000 mg | ORAL_TABLET | Freq: Every day | ORAL | 0 refills | Status: AC

## 2016-12-19 MED ORDER — CIPROFLOXACIN HCL 500 MG PO TABS
500.0000 mg | ORAL_TABLET | Freq: Every day | ORAL | Status: DC
  Administered 2016-12-19: 500 mg via ORAL
  Filled 2016-12-19: qty 1

## 2016-12-19 NOTE — NC FL2 (Signed)
[place] MEDICAID FL2 LEVEL OF CARE SCREENING TOOL     IDENTIFICATION  Patient Name: Stephen Cole Birthdate: 06/20/1932 Sex: male Admission Date (Current Location): 12/15/2016  [facility] [place]   Facility and Address:  [facility],  [address], [place] [id number]      Provider Number: [id number]  Attending Physician Name and Address:  Catarina Hartshornat, David, MD  Relative Name and Phone Number:  Berdine AddisonFrancis,Susan  [phone]     Current Level of Care: SNF Recommended Level of Care: Skilled Nursing Facility Prior Approval Number:    Date Approved/Denied:   PASRR Number: [id number]   Discharge Plan: SNF    Current Diagnoses: Patient Active Problem List   Diagnosis Date Noted  . Pseudomonas sepsis (HCC) 12/19/2016  . Sepsis secondary to UTI (HCC) 12/16/2016  . Glaucoma 12/16/2016  . Hypothyroid 12/16/2016  . Hyperkalemia 12/16/2016  . History of traumatic brain injury 08/15/2013  . Paralysis agitans (HCC) 01/10/2013  . 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 01/10/2013    Orientation RESPIRATION BLADDER Height & Weight     Self, Situation, Place  Normal External catheter, Incontinent (Foley Catheter) Weight: 154 lb 6.4 oz (70 kg) Height:     BEHAVIORAL SYMPTOMS/MOOD NEUROLOGICAL BOWEL NUTRITION STATUS      Incontinent Diet (Heart Healthy--Dysphagia 3 with thin liquids)  AMBULATORY STATUS COMMUNICATION OF NEEDS Skin   Extensive Assist Verbally Normal                       Personal Care Assistance Level of Assistance  Bathing, Feeding, Dressing Bathing Assistance: Maximum assistance Feeding assistance: Limited assistance Dressing Assistance: Maximum assistance     Functional Limitations Info  Sight, Hearing, Speech Sight Info: Adequate Hearing Info: Adequate Speech Info: Adequate    SPECIAL CARE FACTORS FREQUENCY  PT (By licensed PT)     PT Frequency: 3x              Contractures Contractures Info: Present    Additional  Factors Info  Code Status Code Status Info: DNR Allergies Info: No Known Allergies           Current Medications (12/19/2016):  This is the current hospital active medication list Current Facility-Administered Medications  Medication Dose Route Frequency Provider Last Rate Last Dose  . carbidopa-levodopa (SINEMET IR) 25-100 MG per tablet immediate release 3 tablet  3 tablet Oral BID Bobette Mortiz, David Manuel, MD   3 tablet at 12/19/16 1002  . ciprofloxacin (CIPRO) tablet 500 mg  500 mg Oral Q breakfast Tat, David, MD      . docusate sodium (COLACE) capsule 100 mg  100 mg Oral Daily Bobette Mortiz, David Manuel, MD   100 mg at 12/19/16 1002  . feeding supplement (ENSURE ENLIVE) (ENSURE ENLIVE) liquid 237 mL  237 mL Oral BID BM Emokpae, Ejiroghene E, MD   237 mL at 12/18/16 1347  . heparin injection 5,000 Units  5,000 Units Subcutaneous Q8H Bobette Mortiz, David Manuel, MD   5,000 Units at 12/18/16 2317  . latanoprost (XALATAN) 0.005 % ophthalmic solution 1 drop  1 drop Both Eyes QHS Bobette Mortiz, David Manuel, MD   1 drop at 12/18/16 2253  . levothyroxine (SYNTHROID, LEVOTHROID) tablet 100 mcg  100 mcg Oral QAC breakfast Bobette Mortiz, David Manuel, MD   100 mcg at 12/19/16 1001  . polyethylene glycol (MIRALAX / GLYCOLAX) packet 17 g  17 g Oral BID Emokpae, Ejiroghene E, MD   17 g at 12/19/16 1002  Discharge Medications: Please see discharge summary for a list of discharge medications.  Relevant Imaging Results:  Relevant Lab Results:   Additional Information ssn: [id number]  Dominic PeaJeneya G Azya Barbero, LCSW

## 2016-12-19 NOTE — Care Management Important Message (Signed)
Important Message  Patient Details  Name: Stephen Cole MRN: [id number] Date of Birth: [birth year]   Medicare Important Message Given:  Yes    Elliot CousinShavis, Stephen Mcfayden Ellen, RN 12/19/2016, 12:35 PM

## 2016-12-19 NOTE — Progress Notes (Signed)
[facility] [phone], and gave report to Reuel Boomaniel, RN.

## 2016-12-19 NOTE — Progress Notes (Signed)
[facility]  [facility]  Foley catheter in place and patent at discharge.  Leaving with personal belongings and one prescription.  Wife and patient report understanding of discharge instructions.  Room air, denies pain, no s/s of distress. No complaints.

## 2016-12-19 NOTE — Progress Notes (Signed)
Attempted to call [facility] to [facility], [phone].  No person answered and phone went to voice mail.  Left message for return call for report.

## 2016-12-19 NOTE — Care Management Note (Signed)
Case Management Note  Patient Details  Name: Windy KalataFrancis Rietz MRN: [id number] Date of Birth: August 10, 1932  Subjective/Objective:     Sepsis secondary to UTI, PD, CKD               Action/Plan: Discharge Planning: NCM spoke to pt and wife. CSW following for SNF placement. Scheduled dc today.   PCP [name], [name] MD  Expected Discharge Date:  12/19/16               Expected Discharge Plan:  Skilled Nursing Facility  In-House Referral:  NA  Discharge planning Services  CM Consult  Post Acute Care Choice:  NA Choice offered to:  NA  DME Arranged:  N/A DME Agency:  NA  HH Arranged:  NA HH Agency:  NA  Status of Service:  Completed, signed off  If discussed at Long Length of Stay Meetings, dates discussed:    Additional Comments:  Elliot CousinShavis, Bernette Seeman Ellen, RN 12/19/2016, 12:36 PM

## 2016-12-19 NOTE — Clinical Social Work Placement (Addendum)
Pt is ready for discharge today and [facility]. Spouse updated on discharge plan. Authorization received for [employer]-Medicare. RVB [id number] for D/C date: 8/4. CSW confirmed bed with [facility] and sent clinicals via the [facility]. RN aware and will call report to [phone] for room 807. CSW arranged transportation with [facility]. DNR on chart. CSW signing off as no further Social Work needs identified.   CLINICAL SOCIAL WORK PLACEMENT  NOTE  Date:  12/19/2016  Patient Details  Name: Stephen KalataFrancis Scharrer MRN: [id number] Date of Birth: [birth year]  Clinical Social Work is seeking post-discharge placement for this patient at the Skilled  Nursing Facility level of care (*CSW will initial, date and re-position this form in  chart as items are completed):  Yes   Patient/family provided with [place] Clinical Social Work Department's list of facilities offering this level of care within the geographic area requested by the patient (or if unable, by the patient's family).  Yes   Patient/family informed of their freedom to choose among providers that offer the needed level of care, that participate in Medicare, Medicaid or managed care program needed by the patient, have an available bed and are willing to accept the patient.  Yes   Patient/family informed of [facility]'s ownership interest in [facility] and [facility], as well as of the fact that they are under no obligation to receive care at these facilities.  PASRR submitted to EDS on 12/17/16     PASRR number received on 12/17/16     Existing PASRR number confirmed on       FL2 transmitted to all facilities in geographic area requested by pt/family on 12/17/16     FL2 transmitted to all facilities within larger geographic area on       Patient informed that his/her managed care company has contracts with or will negotiate with certain facilities, including the following:        Yes   Patient/family informed of bed  offers received.  [facility]     Physician recommends and patient chooses bed at      [facility]  Patient to be transferred to facility by [facility]     Patient family notified on 12/19/16 of transfer.  Name of family member notified:  Spouse- Berdine AddisonSusan Rhodes [phone]     PHYSICIAN Please prepare prescriptions     Additional Comment:    _______________________________________________ Dominic PeaJeneya G Alfreida Steffenhagen, LCSW 12/19/2016, 1:07 PM

## 2016-12-19 NOTE — Discharge Summary (Signed)
Physician Discharge Summary  Stephen Cole ZOX:[id number] DOB: 07-28-1932 DOA: 12/15/2016  PCP: [name], [name], MD  Admit date: 12/15/2016 Discharge date: 12/19/2016  Admitted From: Home Disposition: SNF  Recommendations for Outpatient Follow-up:  1. Follow up with PCP in 1-2 weeks 2. Please obtain BMP/CBC in one week   Discharge Condition: Stable CODE STATUS:DNR Diet recommendation: Heart Healthy--Dysphagia 3 with thin liquids   Brief/Interim Summary: Stephen BargeFrancis Josephis a 83 y.o.malewith medical history significant of closed head injury, glaucoma, hypothyroidism, Parkinson's disease, chronic constipation who was brought to the emergency department via EMS due to fever since this morning, decreased oral intake and urine output. The patient does not provide much history at this time. His wife however, states that he has been having progressively worse weakness over the past 6 months, with multiple falls, sometimes requiring assistance from EMS to lift him off the floor. The patient only answers simple questions and does not elaborate on his symptoms. Patient is managed for sepsis secondary to UTI.  Discharge Diagnoses:  Sepsis secondary to UTI [facility](HCC)- with tachycardia and leukocytosis 22>> 19. WBCs 05/2016- 29.  -initially started on vanc and zosyn - DC IV vancomycin and Zosyn 8/1 - Start IV ceftriaxone 8/1 >>8/2 when cultures grew Pseudomonas - Follow-up blood cultures--neg - urine cultures, erronously not drawn on admission has been ordered today on original urine sample--40K pseudomonas -discharge with cipro 500 mg daily x 6 more days to complete 7 days of thearpy  Parkinson's disease (HCC)- Having multiple falls in the past few months, family states that the patient is very weak for transfer. Follows with neurologist Dr. Arbutus Leasat. Continue Sinemet 25-100 milligrams 3 tablets by mouth twice a day. - Physical therapy for evaluation- recommended SNF  - social work working on placement  -  Swallow eval-->dysphagia 3 diet with thin liquids  CKD stage 3 -  -baseline creatinine 1.8-1.9  Dehydaration -serum creatinine peaked 2.13 -improved with IVF  Leukocytosis, Thrombocytosis-  This appeared to be chronic. Last cbc- 6 month ago revealed wbc- 29, with elevated absolute neutrophil count platelets- 417. - Follow-up as outpatient, with PCP and possibly hematologist -improving with tx of infection -presenting WBC 22.1 and plateletes 544 -WBC 15.8 and plateletes 504 at time of d/c  Glaucoma Currently not using drops regularly. His wife stated that his most recent eye pressure was normal. Resume Xalatan drops 1 in the hospital.  Hypothyroid Continue Synthroid 100 g by mouth daily.  Hyperkalemia Continue IV fluids-->resolved Follow-up potassium level.  Constipation Continue MiraLAX and Colace.   Discharge Instructions  Discharge Instructions    Increase activity slowly    Complete by:  As directed      Allergies as of 12/19/2016   No Known Allergies     Medication List    STOP taking these medications   amoxicillin-clavulanate 875-125 MG tablet Commonly known as:  AUGMENTIN     TAKE these medications   carbidopa-levodopa 25-100 MG tablet Commonly known as:  SINEMET IR Take 3 tablets by mouth 2 (two) times daily. What changed:  Another medication with the same name was removed. Continue taking this medication, and follow the directions you see here.   ciprofloxacin 500 MG tablet Commonly known as:  CIPRO Take 1 tablet (500 mg total) by mouth daily with breakfast. X 6 days   docusate sodium 100 MG capsule Commonly known as:  COLACE Take 100 mg by mouth daily.   DUODERM HYDROACTIVE Gel Apply 1 application topically daily as needed for wound care.   multivitamin  with minerals Tabs tablet Take 1 tablet by mouth daily as needed (for cold symptoms).   OVER THE COUNTER MEDICATION Take 1 packet by mouth daily as needed (for constipation).  *Smooth Move Tea*   phenylephrine 10 MG Tabs tablet Commonly known as:  SUDAFED PE Take 10 mg by mouth 2 (two) times daily as needed (for cold).   polyethylene glycol powder powder Commonly known as:  GLYCOLAX/MIRALAX Take 17 g by mouth 2 (two) times daily.   SYNTHROID 100 MCG tablet Generic drug:  levothyroxine Take 100 mcg by mouth daily before breakfast.      Contact information for after-discharge care    Destination    [facility] .   Specialty:  Skilled Nursing Facility Contact information: [address] [place] [address] [phone]             No Known Allergies  Consultations:  none   Procedures/Studies: Dg Chest 2 View  Result Date: 12/15/2016 CLINICAL DATA:  83 year old male with fever, decreased fluid intake and decreased urine output. EXAM: CHEST  2 VIEW COMPARISON:  CT Abdomen and Pelvis 06/03/2016 FINDINGS: Semi upright AP and lateral views of the chest. Somewhat low lung volumes. No pneumothorax or pneumoperitoneum. Normal cardiac size and mediastinal contours. No pulmonary edema, pleural effusion or confluent pulmonary opacity. Flowing osteophytes or syndesmophytes in the thoracic spine. Chronic left clavicle deformity. No acute osseous abnormality identified. Negative visible bowel gas pattern. Chronic 2 cm gallstone in the right upper quadrant. IMPRESSION: 1.  No acute cardiopulmonary abnormality. 2. Chronic cholelithiasis. Electronically Signed   By: Odessa Fleming M.D.   On: 12/15/2016 21:59        Discharge Exam: Vitals:   12/18/16 2027 12/19/16 0544  BP: 131/71 (!) 149/81  Pulse: 92 93  Resp: 18 16  Temp: 98.6 F (37 C) 98.8 F (37.1 C)   Vitals:   12/18/16 0452 12/18/16 1527 12/18/16 2027 12/19/16 0544  BP: (!) 152/79 (!) 149/81 131/71 (!) 149/81  Pulse: 91 (!) 107 92 93  Resp: 18 18 18 16   Temp: 98.3 F (36.8 C) 98.5 F (36.9 C) 98.6 F (37 C) 98.8 F (37.1 C)  TempSrc: Oral Oral Oral Oral  SpO2: 94% 97%  96% 97%  Weight:        General: Pt is alert, awake, not in acute distress Cardiovascular: RRR, S1/S2 +, no rubs, no gallops Respiratory: CTA bilaterally, no wheezing, no rhonchi Abdominal: Soft, NT, ND, bowel sounds + Extremities: no edema, no cyanosis   The results of significant diagnostics from this hospitalization (including imaging, microbiology, ancillary and laboratory) are listed below for reference.    Significant Diagnostic Studies: Dg Chest 2 View  Result Date: 12/15/2016 CLINICAL DATA:  83 year old male with fever, decreased fluid intake and decreased urine output. EXAM: CHEST  2 VIEW COMPARISON:  CT Abdomen and Pelvis 06/03/2016 FINDINGS: Semi upright AP and lateral views of the chest. Somewhat low lung volumes. No pneumothorax or pneumoperitoneum. Normal cardiac size and mediastinal contours. No pulmonary edema, pleural effusion or confluent pulmonary opacity. Flowing osteophytes or syndesmophytes in the thoracic spine. Chronic left clavicle deformity. No acute osseous abnormality identified. Negative visible bowel gas pattern. Chronic 2 cm gallstone in the right upper quadrant. IMPRESSION: 1.  No acute cardiopulmonary abnormality. 2. Chronic cholelithiasis. Electronically Signed   By: Odessa Fleming M.D.   On: 12/15/2016 21:59     Microbiology: Recent Results (from the past 240 hour(s))  Culture, blood (Routine x 2)  Status: None (Preliminary result)   Collection Time: 12/15/16  9:28 PM  Result Value Ref Range Status   Specimen Description BLOOD RIGHT FOREARM  Final   Special Requests   Final    BOTTLES DRAWN AEROBIC AND ANAEROBIC Blood Culture adequate volume   Culture   Final    NO GROWTH 2 DAYS Performed at [facility], [address] [place], [place] [address]    Report Status PENDING  Incomplete  Culture, blood (Routine x 2)     Status: None (Preliminary result)   Collection Time: 12/15/16  9:40 PM  Result Value Ref Range Status   Specimen Description  BLOOD BLOOD LEFT FOREARM  Final   Special Requests   Final    BOTTLES DRAWN AEROBIC AND ANAEROBIC Blood Culture adequate volume   Culture   Final    NO GROWTH 2 DAYS Performed at [facility], [address] [place], [place] [address]    Report Status PENDING  Incomplete  Culture, Urine     Status: Abnormal   Collection Time: 12/16/16  1:18 AM  Result Value Ref Range Status   Specimen Description URINE, CATHETERIZED  Final   Special Requests NONE  Final   Culture 40,000 COLONIES/mL PSEUDOMONAS AERUGINOSA (A)  Final   Report Status 12/19/2016 FINAL  Final   Organism ID, Bacteria PSEUDOMONAS AERUGINOSA (A)  Final      Susceptibility   Pseudomonas aeruginosa - MIC*    CEFTAZIDIME 4 SENSITIVE Sensitive     CIPROFLOXACIN <=0.25 SENSITIVE Sensitive     GENTAMICIN <=1 SENSITIVE Sensitive     IMIPENEM 2 SENSITIVE Sensitive     PIP/TAZO <=4 SENSITIVE Sensitive     CEFEPIME 2 SENSITIVE Sensitive     * 40,000 COLONIES/mL PSEUDOMONAS AERUGINOSA     Labs: Basic Metabolic Panel:  Recent Labs Lab 12/15/16 2125 12/16/16 0522 12/17/16 0800  NA 135 135 141  K 5.2* 4.5 4.1  CL 101 105 108  CO2 23 20* 25  GLUCOSE 109* 109* 105*  BUN 42* 41* 39*  CREATININE 1.99* 2.13* 1.97*  CALCIUM 8.4* 7.9* 8.5*   Liver Function Tests:  Recent Labs Lab 12/15/16 2125  AST 21  ALT 5*  ALKPHOS 79  BILITOT 0.5  PROT 6.2*  ALBUMIN 3.2*   No results for input(s): LIPASE, AMYLASE in the last 168 hours. No results for input(s): AMMONIA in the last 168 hours. CBC:  Recent Labs Lab 12/15/16 2125 12/16/16 0522 12/17/16 0800 12/18/16 0556  WBC 22.1* 20.6* 19.7* 15.8*  NEUTROABS 17.6* 16.5* 15.6*  --   HGB 15.2 13.5 13.1 12.7*  HCT 44.8 40.0 40.0 38.4*  MCV 90.3 88.7 92.2 91.6  PLT 544* 516* 488* 504*   Cardiac Enzymes: No results for input(s): CKTOTAL, CKMB, CKMBINDEX, TROPONINI in the last 168 hours. BNP: Invalid input(s): POCBNP CBG:  Recent Labs Lab 12/15/16 2122  GLUCAP  111*    Time coordinating discharge:  Greater than 30 minutes  Signed:  Nehemiah Mcfarren, DO [facility] Pager: [phone] 12/19/2016, 10:29 AM

## 2016-12-21 LAB — CULTURE, BLOOD (ROUTINE X 2)
CULTURE: NO GROWTH
CULTURE: NO GROWTH
SPECIAL REQUESTS: ADEQUATE
Special Requests: ADEQUATE

## 2016-12-22 DIAGNOSIS — G20 Parkinson's disease: Secondary | ICD-10-CM | POA: Diagnosis not present

## 2016-12-22 DIAGNOSIS — N390 Urinary tract infection, site not specified: Secondary | ICD-10-CM | POA: Diagnosis not present

## 2016-12-22 DIAGNOSIS — A419 Sepsis, unspecified organism: Secondary | ICD-10-CM | POA: Diagnosis not present

## 2016-12-22 DIAGNOSIS — R531 Weakness: Secondary | ICD-10-CM | POA: Diagnosis not present

## 2016-12-29 DIAGNOSIS — G20 Parkinson's disease: Secondary | ICD-10-CM | POA: Diagnosis not present

## 2016-12-29 DIAGNOSIS — F0390 Unspecified dementia without behavioral disturbance: Secondary | ICD-10-CM | POA: Diagnosis not present

## 2017-01-01 DIAGNOSIS — R630 Anorexia: Secondary | ICD-10-CM | POA: Diagnosis not present

## 2017-01-01 DIAGNOSIS — K5900 Constipation, unspecified: Secondary | ICD-10-CM | POA: Diagnosis not present

## 2017-01-15 DIAGNOSIS — G20 Parkinson's disease: Secondary | ICD-10-CM | POA: Diagnosis not present

## 2017-01-20 DIAGNOSIS — R531 Weakness: Secondary | ICD-10-CM | POA: Diagnosis not present

## 2017-01-20 DIAGNOSIS — S20412A Abrasion of left back wall of thorax, initial encounter: Secondary | ICD-10-CM | POA: Diagnosis not present

## 2017-01-20 DIAGNOSIS — A419 Sepsis, unspecified organism: Secondary | ICD-10-CM | POA: Diagnosis not present

## 2017-01-20 DIAGNOSIS — G20 Parkinson's disease: Secondary | ICD-10-CM | POA: Diagnosis not present

## 2017-01-25 DIAGNOSIS — L89120 Pressure ulcer of left upper back, unstageable: Secondary | ICD-10-CM | POA: Diagnosis not present

## 2017-01-25 DIAGNOSIS — G20 Parkinson's disease: Secondary | ICD-10-CM | POA: Diagnosis not present

## 2017-01-25 DIAGNOSIS — Z87820 Personal history of traumatic brain injury: Secondary | ICD-10-CM | POA: Diagnosis not present

## 2017-01-29 DIAGNOSIS — G20 Parkinson's disease: Secondary | ICD-10-CM | POA: Diagnosis not present

## 2017-01-29 DIAGNOSIS — F0390 Unspecified dementia without behavioral disturbance: Secondary | ICD-10-CM | POA: Diagnosis not present

## 2017-02-01 DIAGNOSIS — G20 Parkinson's disease: Secondary | ICD-10-CM | POA: Diagnosis not present

## 2017-02-01 DIAGNOSIS — R531 Weakness: Secondary | ICD-10-CM | POA: Diagnosis not present

## 2017-02-01 DIAGNOSIS — R630 Anorexia: Secondary | ICD-10-CM | POA: Diagnosis not present

## 2017-02-01 DIAGNOSIS — L89120 Pressure ulcer of left upper back, unstageable: Secondary | ICD-10-CM | POA: Diagnosis not present

## 2017-02-01 DIAGNOSIS — A419 Sepsis, unspecified organism: Secondary | ICD-10-CM | POA: Diagnosis not present

## 2017-02-04 DIAGNOSIS — G20 Parkinson's disease: Secondary | ICD-10-CM | POA: Diagnosis not present

## 2017-02-04 DIAGNOSIS — R05 Cough: Secondary | ICD-10-CM | POA: Diagnosis not present

## 2017-02-04 DIAGNOSIS — R531 Weakness: Secondary | ICD-10-CM | POA: Diagnosis not present

## 2017-02-04 DIAGNOSIS — R509 Fever, unspecified: Secondary | ICD-10-CM | POA: Diagnosis not present

## 2017-02-04 DIAGNOSIS — A419 Sepsis, unspecified organism: Secondary | ICD-10-CM | POA: Diagnosis not present

## 2017-02-05 DIAGNOSIS — S20412A Abrasion of left back wall of thorax, initial encounter: Secondary | ICD-10-CM | POA: Diagnosis not present

## 2017-02-05 DIAGNOSIS — R630 Anorexia: Secondary | ICD-10-CM | POA: Diagnosis not present

## 2017-02-05 DIAGNOSIS — G20 Parkinson's disease: Secondary | ICD-10-CM | POA: Diagnosis not present

## 2017-02-05 DIAGNOSIS — R531 Weakness: Secondary | ICD-10-CM | POA: Diagnosis not present

## 2017-08-20 DIAGNOSIS — R05 Cough: Secondary | ICD-10-CM | POA: Diagnosis not present

## 2018-06-02 DIAGNOSIS — G20 Parkinson's disease: Secondary | ICD-10-CM | POA: Diagnosis not present

## 2018-06-02 DIAGNOSIS — J188 Other pneumonia, unspecified organism: Secondary | ICD-10-CM | POA: Diagnosis not present

## 2018-06-02 DIAGNOSIS — E038 Other specified hypothyroidism: Secondary | ICD-10-CM | POA: Diagnosis not present

## 2018-06-02 DIAGNOSIS — R05 Cough: Secondary | ICD-10-CM | POA: Diagnosis not present

## 2018-06-14 DIAGNOSIS — Z515 Encounter for palliative care: Secondary | ICD-10-CM | POA: Diagnosis not present

## 2018-06-14 DIAGNOSIS — J188 Other pneumonia, unspecified organism: Secondary | ICD-10-CM | POA: Diagnosis not present

## 2018-06-14 DIAGNOSIS — E038 Other specified hypothyroidism: Secondary | ICD-10-CM | POA: Diagnosis not present

## 2018-06-14 DIAGNOSIS — G20 Parkinson's disease: Secondary | ICD-10-CM | POA: Diagnosis not present

## 2018-07-01 DIAGNOSIS — R05 Cough: Secondary | ICD-10-CM | POA: Diagnosis not present

## 2018-07-01 DIAGNOSIS — J188 Other pneumonia, unspecified organism: Secondary | ICD-10-CM | POA: Diagnosis not present

## 2018-08-11 DIAGNOSIS — G20 Parkinson's disease: Secondary | ICD-10-CM | POA: Diagnosis not present

## 2018-08-11 DIAGNOSIS — R1312 Dysphagia, oropharyngeal phase: Secondary | ICD-10-CM | POA: Diagnosis not present

## 2018-08-11 DIAGNOSIS — E038 Other specified hypothyroidism: Secondary | ICD-10-CM | POA: Diagnosis not present

## 2018-08-11 DIAGNOSIS — K5901 Slow transit constipation: Secondary | ICD-10-CM | POA: Diagnosis not present

## 2018-10-17 DEATH — deceased

## 2019-01-06 IMAGING — CT CT ABD-PELV W/O CM
2 of 4 series · 15 of 46 positions shown, 17 images · non-contrast
Comparison: None.

CLINICAL DATA: Lower abdominal pain, constipation, difficulty
urinating. Flu like symptoms.

EXAM:
CT ABDOMEN AND PELVIS WITHOUT CONTRAST
TECHNIQUE: Multidetector CT imaging of the abdomen and pelvis was performed
following the standard protocol without IV contrast.

[Series 2: abd/pel w/o · axial · non-contrast · 0.84mm/px · z∈[-492,-17]mm · 12 of 107 slices shown, 14 images]
[im 6/107  soft-tissue]
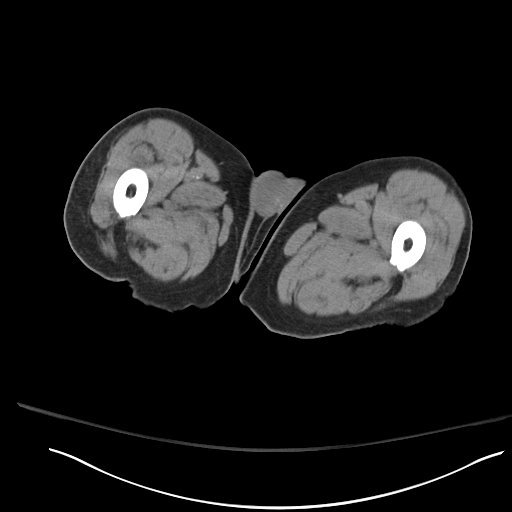
[im 6/107  bone]
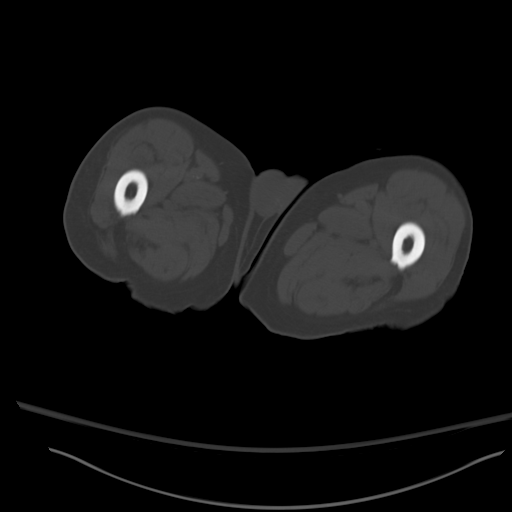
[im 16/107  soft-tissue]
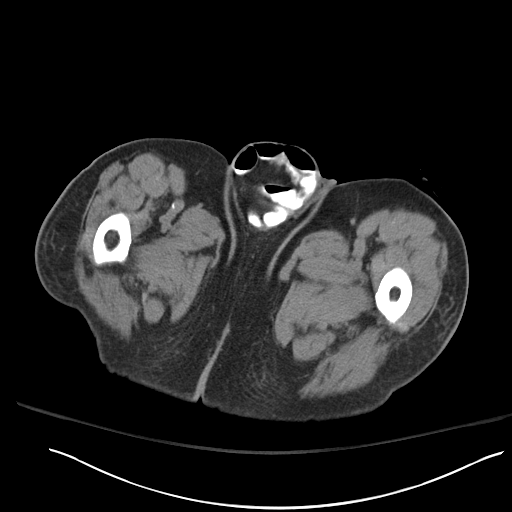
[im 22/107  soft-tissue]
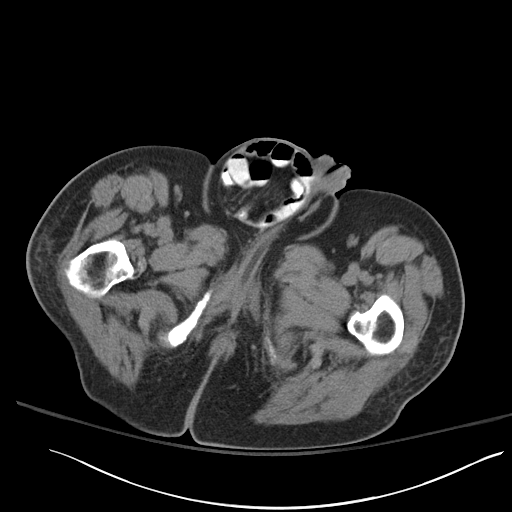
[im 32/107  soft-tissue]
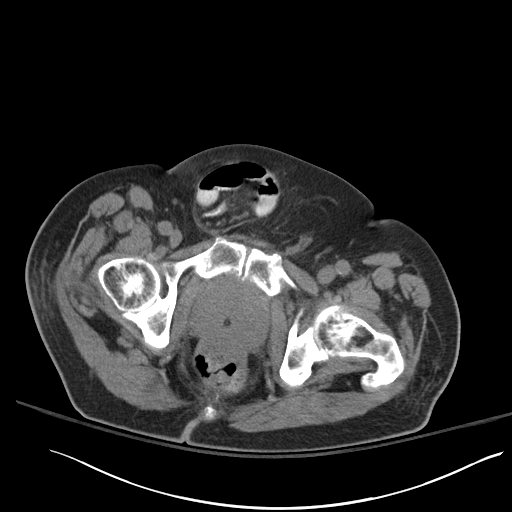
[im 43/107  soft-tissue]
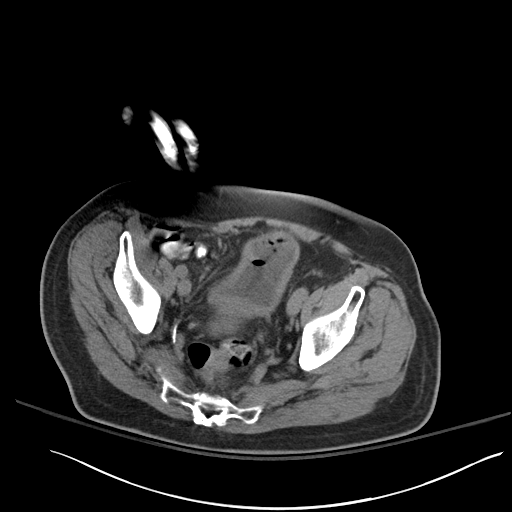
[im 48/107  soft-tissue]
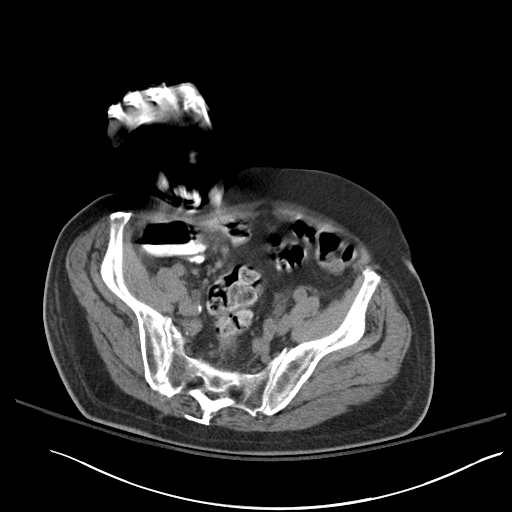
[im 59/107  soft-tissue]
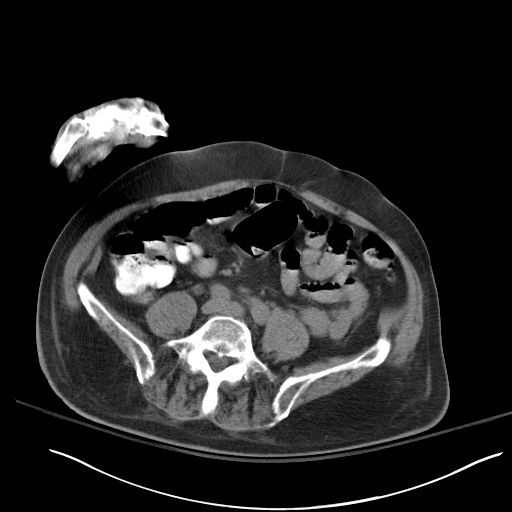
[im 64/107  soft-tissue]
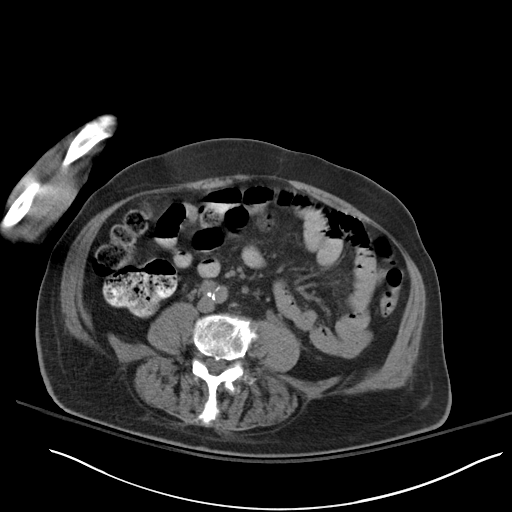
[im 75/107  soft-tissue]
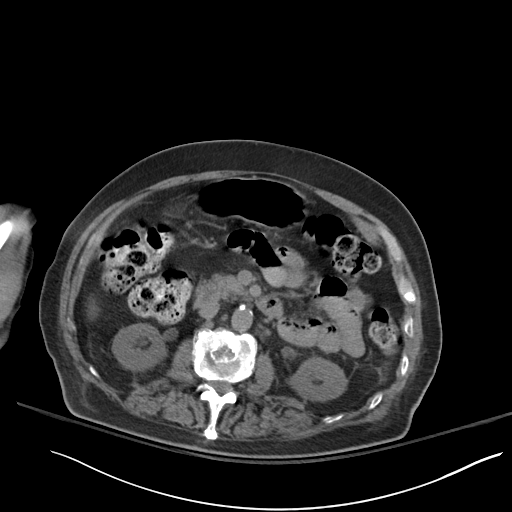
[im 75/107  bone]
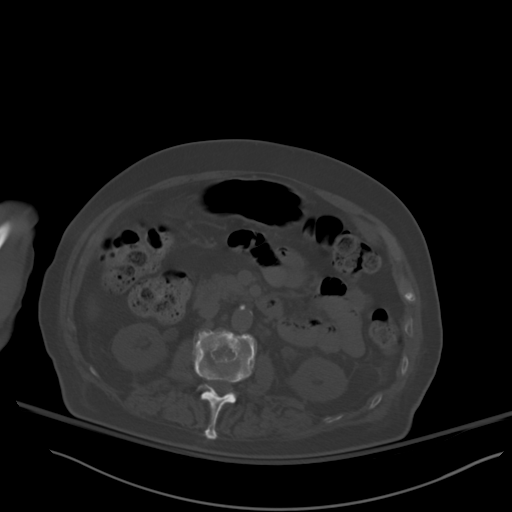
[im 85/107  soft-tissue]
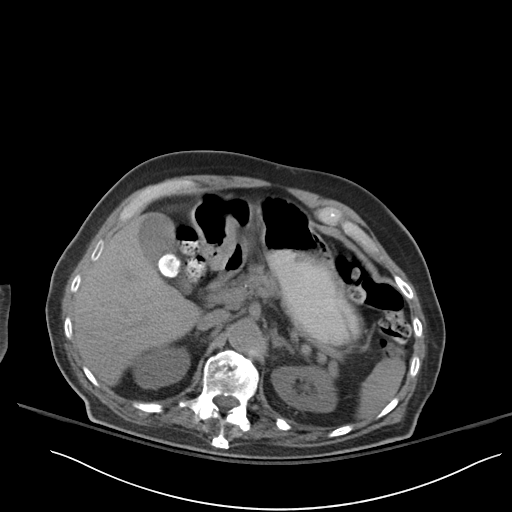
[im 91/107  soft-tissue]
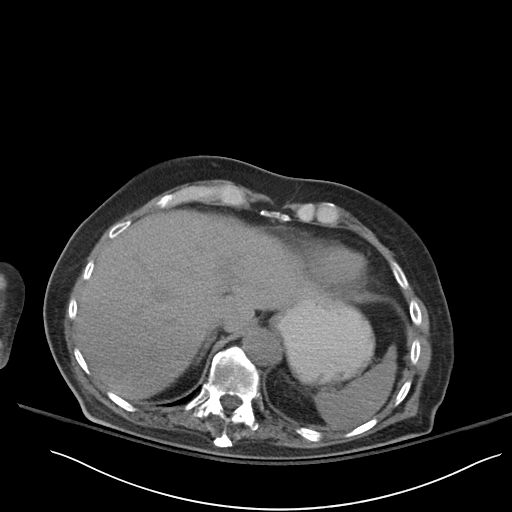
[im 101/107  soft-tissue]
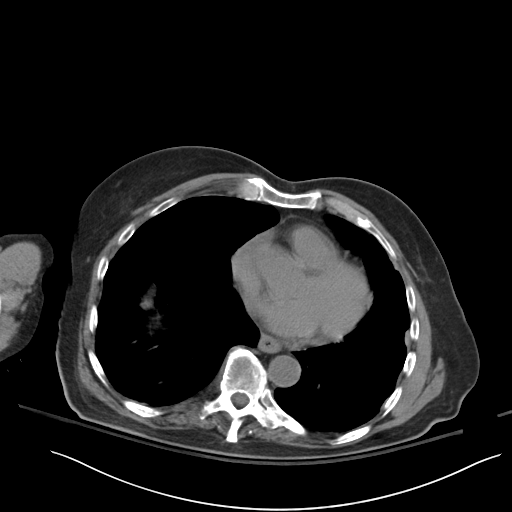

[Series 5: coronal · coronal · 0.78mm/px · 3 of 149 slices shown]
[im 50/149  soft-tissue]
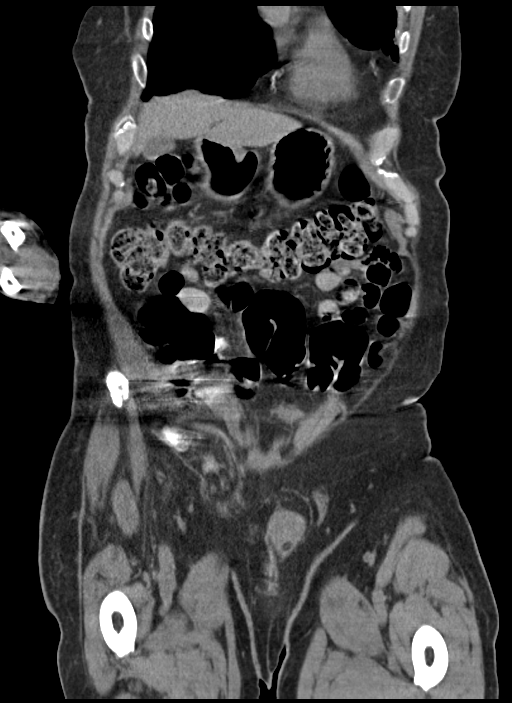
[im 66/149  soft-tissue]
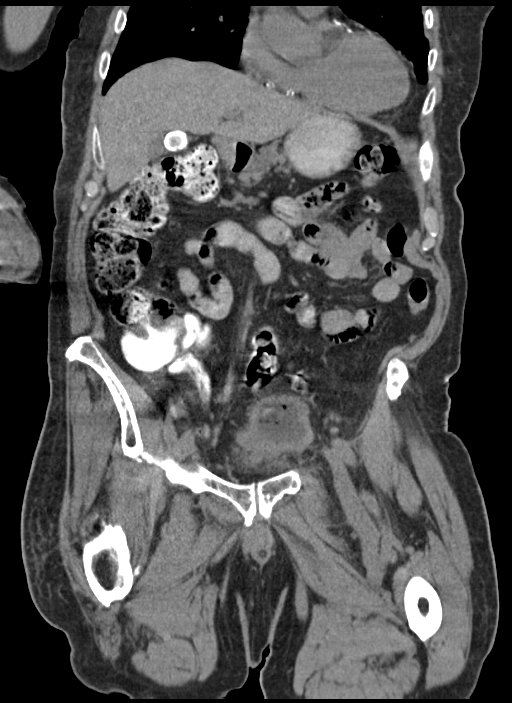
[im 83/149  soft-tissue]
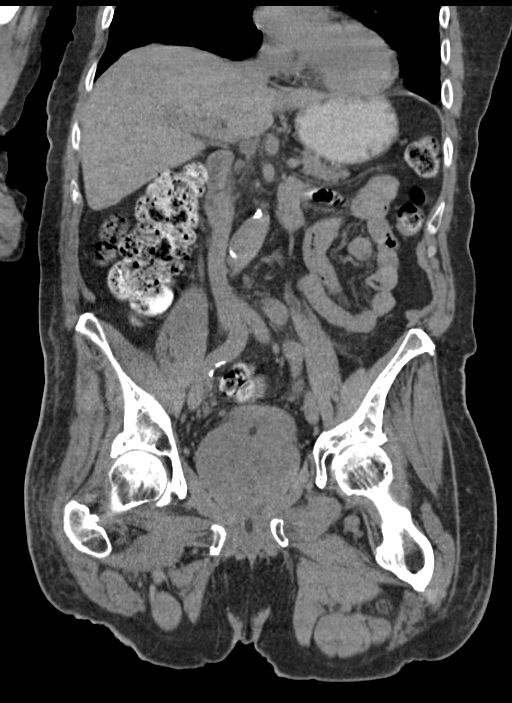

[15 of 46 positions shown; findings below may reference images not displayed]

FINDINGS: Lower chest: Patchy airspace disease and atelectasis in the lingula
suspicious for pneumonia. Bibasilar atelectasis. Coronary
arteriosclerosis noted of the top-normal sized heart. No pericardial
effusion.

Hepatobiliary: Uncomplicated cholelithiasis with single 2 cm
gallstone noted in the body of the gallbladder. No wall thickening
nor pericholecystic fluid. Unremarkable unenhanced liver. No biliary
dilatation.

Pancreas: Normal

Spleen: Normal

Adrenals/Urinary Tract: No obstructive uropathy. Normal adrenal
glands bilaterally. Urinary bladder is partially decompressed by
Foley catheter. No nephrolithiasis.

Stomach/Bowel: Stomach is moderately distended with oral contrast.
No small bowel dilatation. Right inguinal hernia containing loops of
ileum go. Increased colonic stool retention within large bowel
consistent with constipation to the level of the rectum.

Vascular/Lymphatic: Aortoiliac atherosclerosis.  No lymphadenopathy.

Reproductive: Marked prostatic enlargement measuring approximately
8.3 x 6.8 x 6.5 cm (volume = 190 cm^3).

Other: Small umbilical fat containing hernia. No ascites or free
air.

Musculoskeletal: Lumbar spondylosis. Mild compression of L1 likely
chronic without retropulsion. Multilevel degenerative disc disease
noted from L1 through L5. Schmorl's nodes noted at L1 and L2.
IMPRESSION: 1. Patchy airspace disease in the lingula suspicious for pneumonia.
2. Large right-sided small bowel and fat containing inguinal hernia
without bowel obstruction.
3. Prostate enlargement with Foley balloon and decompressed bladder.

## 2019-07-20 IMAGING — CR DG CHEST 2V
2 series · 2 of 2 positions shown · non-contrast
Comparison: CT Abdomen and Pelvis 06/03/2016

CLINICAL DATA: 84-year-old male with fever, decreased fluid intake
and decreased urine output.

EXAM:
CHEST  2 VIEW

[w chest lat]
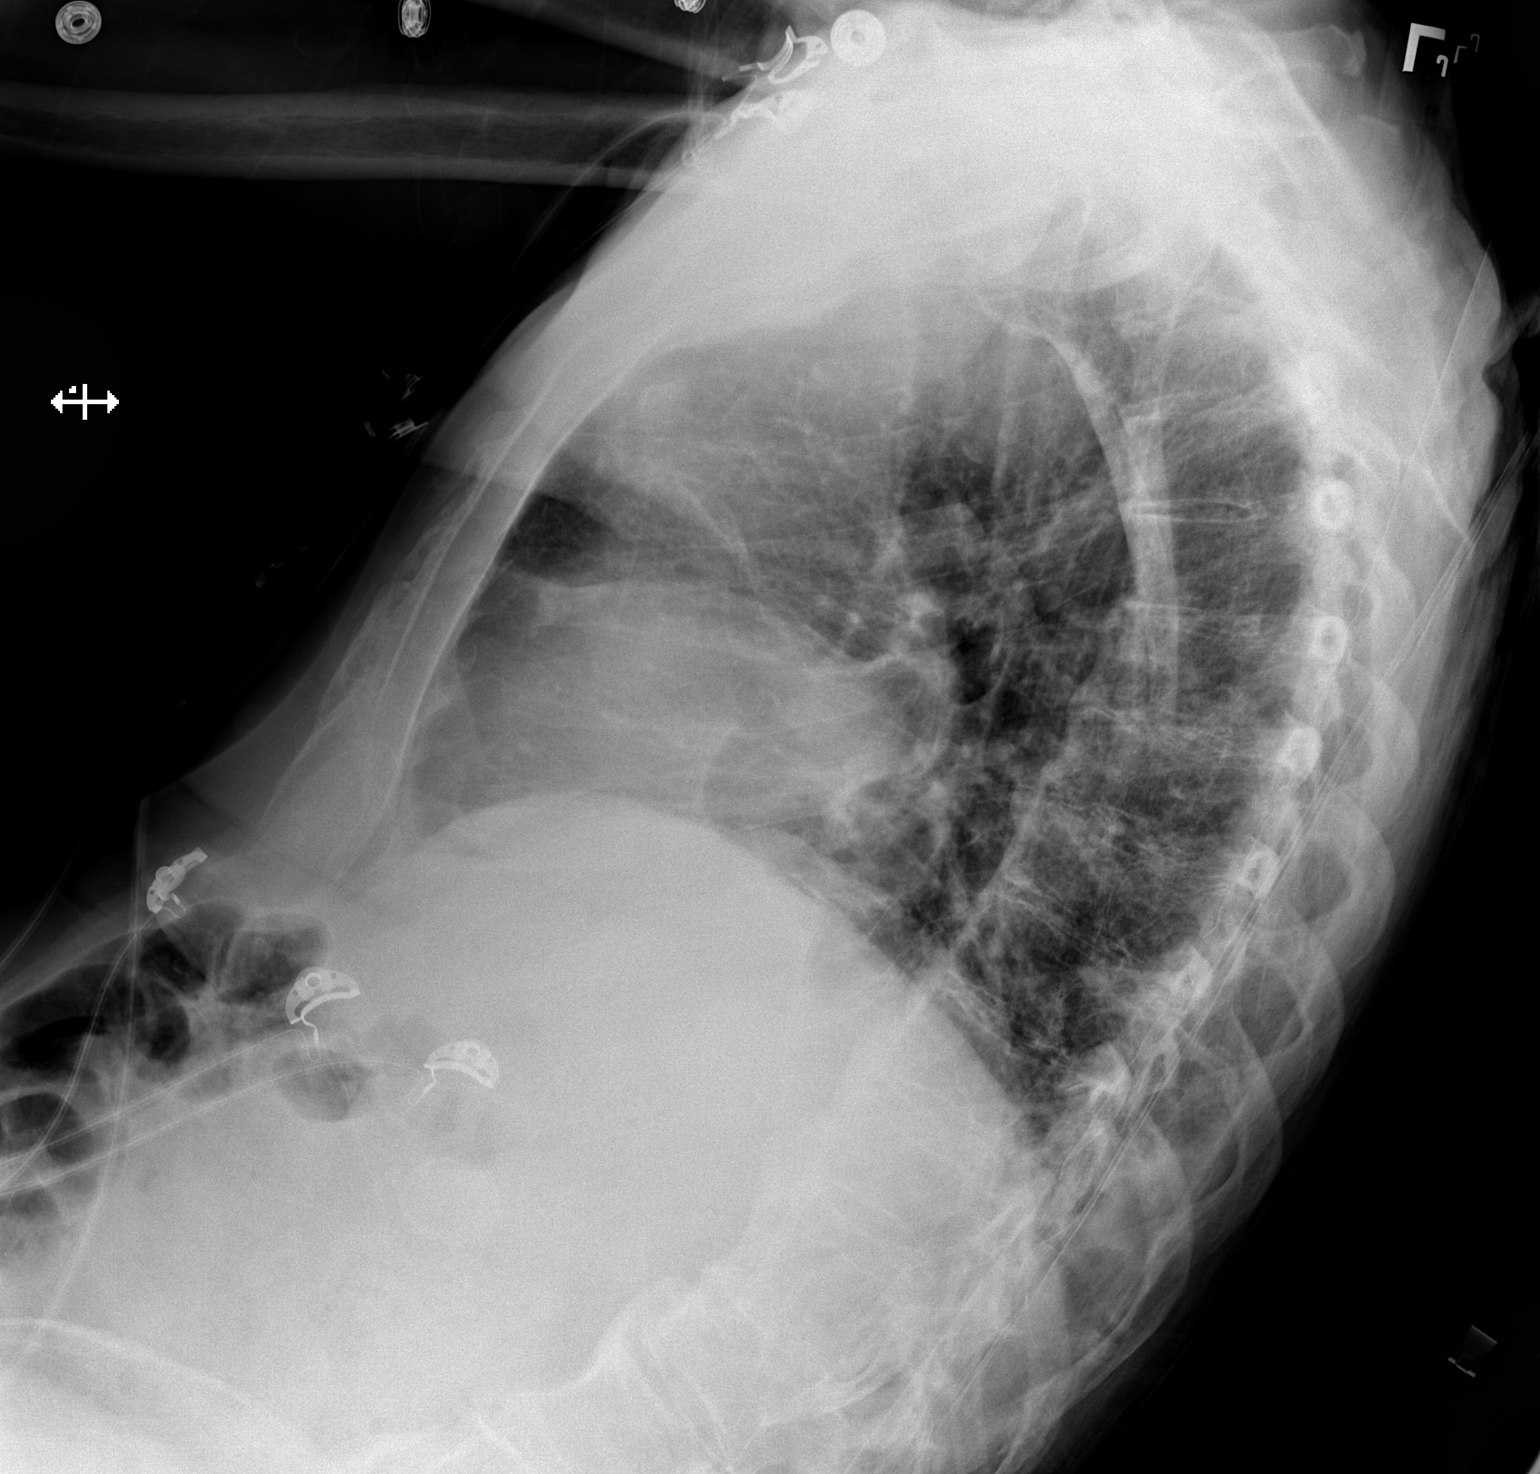

[x chest ap]
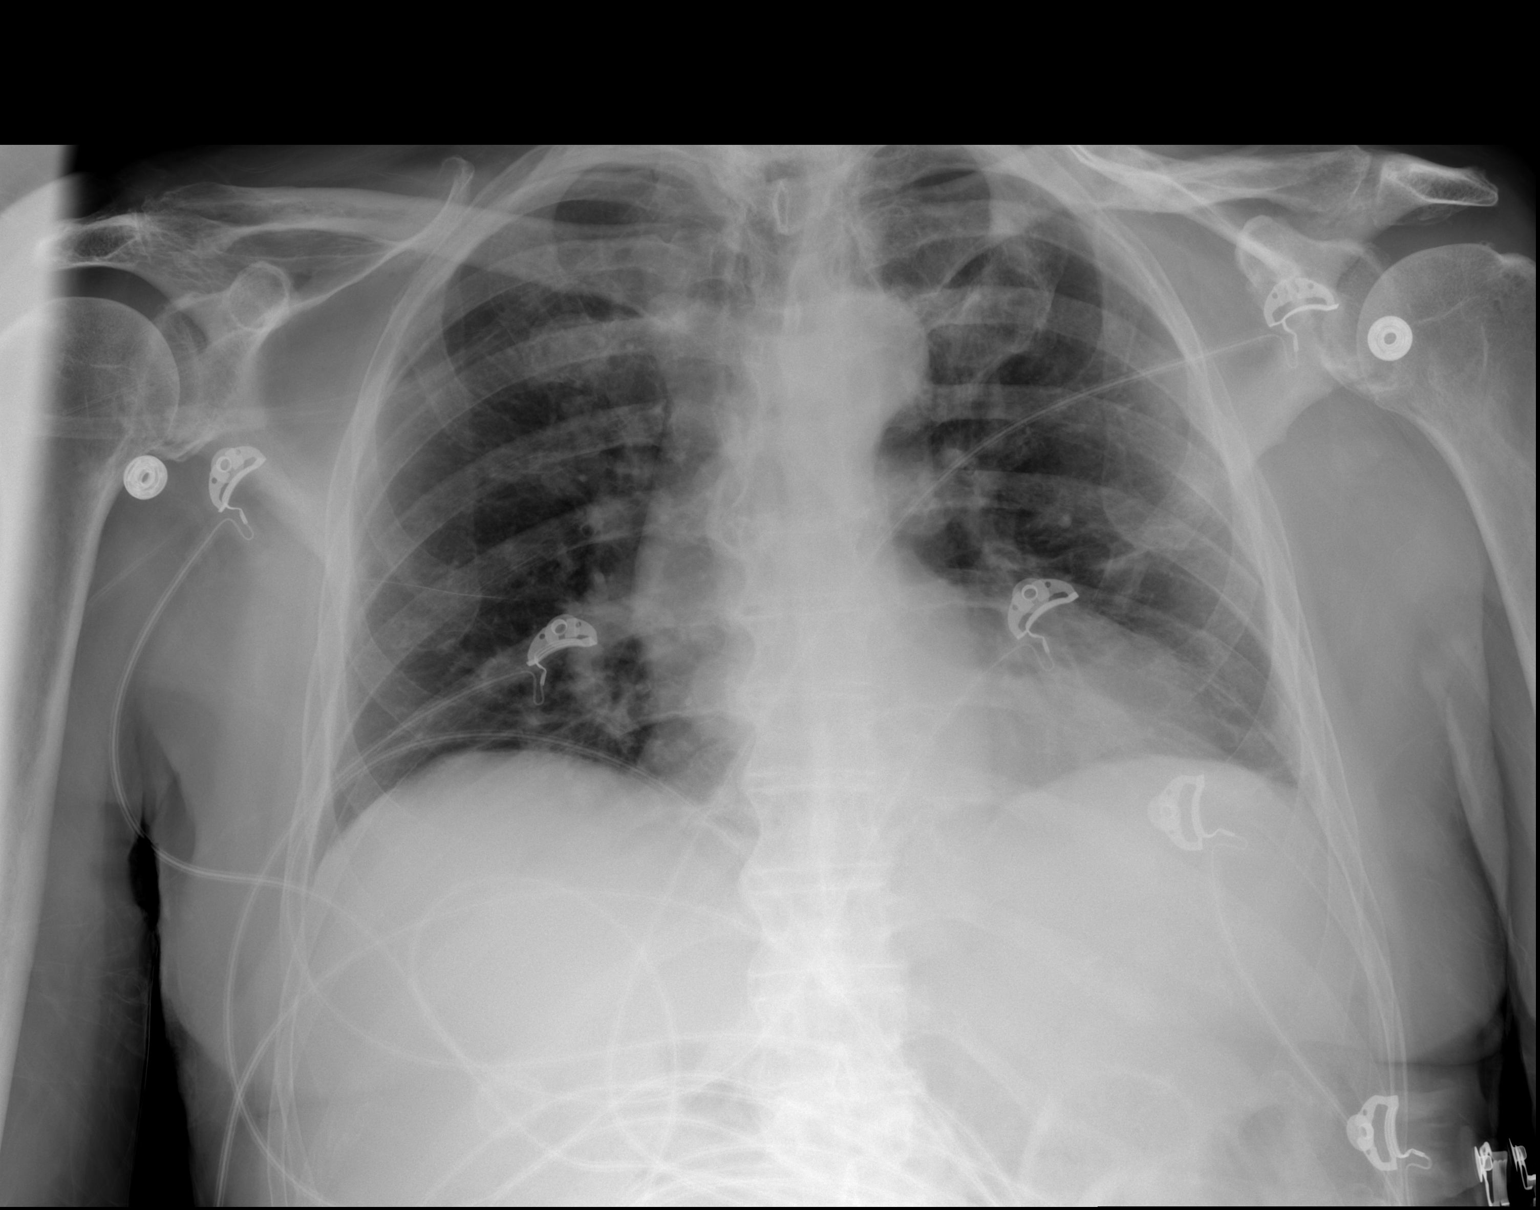

[2 of 2 positions shown; findings below may reference images not displayed]

FINDINGS: Semi upright AP and lateral views of the chest. Somewhat low lung
volumes. No pneumothorax or pneumoperitoneum. Normal cardiac size
and mediastinal contours. No pulmonary edema, pleural effusion or
confluent pulmonary opacity. Flowing osteophytes or syndesmophytes
in the thoracic spine. Chronic left clavicle deformity. No acute
osseous abnormality identified. Negative visible bowel gas pattern.
Chronic 2 cm gallstone in the right upper quadrant.
IMPRESSION: 1.  No acute cardiopulmonary abnormality.
2. Chronic cholelithiasis.
